# Patient Record
Sex: Female | Born: 1995
Health system: Southern US, Community
[De-identification: ages and names within clinical notes are randomized; demographics above are authoritative.]

## PROBLEM LIST (undated history)

## (undated) DIAGNOSIS — K8020 Calculus of gallbladder without cholecystitis without obstruction: Secondary | ICD-10-CM

## (undated) DIAGNOSIS — O139 Gestational [pregnancy-induced] hypertension without significant proteinuria, unspecified trimester: Secondary | ICD-10-CM

## (undated) DIAGNOSIS — Z789 Other specified health status: Secondary | ICD-10-CM

## (undated) HISTORY — DX: Other specified health status: Z78.9

## (undated) HISTORY — PX: NO PAST SURGERIES: SHX2092

## (undated) HISTORY — DX: Gestational (pregnancy-induced) hypertension without significant proteinuria, unspecified trimester: O13.9

---
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
## 2016-10-05 DIAGNOSIS — H5213 Myopia, bilateral: Secondary | ICD-10-CM | POA: Diagnosis not present

## 2017-08-02 MED FILL — PRENATAL 27-1 MG TAB: 27-1 | 30 days supply | Qty: 30 | Fill #0

## 2017-09-21 DIAGNOSIS — Z23 Encounter for immunization: Secondary | ICD-10-CM | POA: Diagnosis not present

## 2017-10-23 ENCOUNTER — Ambulatory Visit (INDEPENDENT_AMBULATORY_CARE_PROVIDER_SITE_OTHER): Payer: Medicaid Other | Admitting: Advanced Practice Midwife

## 2017-10-23 ENCOUNTER — Encounter: Payer: Self-pay | Admitting: Advanced Practice Midwife

## 2017-10-23 ENCOUNTER — Other Ambulatory Visit (HOSPITAL_COMMUNITY)
Admission: RE | Admit: 2017-10-23 | Discharge: 2017-10-23 | Disposition: A | Discharge status: Home / Self Care | Payer: Medicaid Other | Source: Ambulatory Visit | Attending: Advanced Practice Midwife | Admitting: Advanced Practice Midwife

## 2017-10-23 VITALS — BP 127/76 | HR 124 | Ht 65.0 in | Wt 169.0 lb

## 2017-10-23 DIAGNOSIS — Z3490 Encounter for supervision of normal pregnancy, unspecified, unspecified trimester: Secondary | ICD-10-CM

## 2017-10-23 DIAGNOSIS — O0932 Supervision of pregnancy with insufficient antenatal care, second trimester: Secondary | ICD-10-CM | POA: Diagnosis not present

## 2017-10-23 DIAGNOSIS — Z3A00 Weeks of gestation of pregnancy not specified: Secondary | ICD-10-CM | POA: Insufficient documentation

## 2017-10-23 DIAGNOSIS — Z3402 Encounter for supervision of normal first pregnancy, second trimester: Secondary | ICD-10-CM | POA: Insufficient documentation

## 2017-10-23 DIAGNOSIS — Z8659 Personal history of other mental and behavioral disorders: Secondary | ICD-10-CM | POA: Insufficient documentation

## 2017-10-23 NOTE — Progress Notes (Signed)
  Subjective:    Kristina Brown is a G1P0 10144w1d being seen today for her first obstetrical visit.  Her obstetrical history is significant for Late prenatal care, History of depression. Patient does intend to breast feed. Pregnancy history fully reviewed.  Patient reports no complaints.  Vitals:   10/23/17 0905 10/23/17 0907  BP: 127/76   Pulse: (!) 124   Weight: 169 lb (76.7 kg)   Height:  5\' 5"  (1.651 m)    HISTORY: OB History  Gravida Para Term Preterm AB Living  1            SAB TAB Ectopic Multiple Live Births               # Outcome Date GA Lbr Len/2nd Weight Sex Delivery Anes PTL Lv  1 Current              Past Medical History:  Diagnosis Date  . Medical history non-contributory    History reviewed. No pertinent surgical history. Family History  Problem Relation Age of Onset  . Diabetes Mother   . Stroke Maternal Grandmother   . Cancer Paternal Grandmother        breast  . Birth defects Neg Hx   . Hypertension Neg Hx      Exam    Uterus:     Pelvic Exam:    Perineum: Normal Perineum   Vulva: Bartholin's, Urethra, Skene's normal   Vagina:  normal discharge   pH:    Cervix: nulliparous appearance   Adnexa: no mass, fullness, tenderness   Bony Pelvis: gynecoid  System: Breast:  normal appearance, no masses or tenderness   Skin: normal coloration and turgor, no rashes    Neurologic: oriented, grossly non-focal   Extremities: normal strength, tone, and muscle mass   HEENT neck supple with midline trachea   Mouth/Teeth mucous membranes moist, pharynx normal without lesions   Neck supple   Cardiovascular: regular rate and rhythm   Respiratory:  appears well, vitals normal, no respiratory distress, acyanotic, normal RR, ear and throat exam is normal, neck free of mass or lymphadenopathy, chest clear, no wheezing, crepitations, rhonchi, normal symmetric air entry   Abdomen: soft, non-tender; bowel sounds normal; no masses,  no organomegaly   Urinary:  urethral meatus normal      Assessment:    Pregnancy: G1P0 Patient Active Problem List   Diagnosis Date Noted  . History of depression 10/23/2017  . Encounter for supervision of normal first pregnancy in second trimester 10/23/2017  . Late prenatal care affecting pregnancy in second trimester 10/23/2017        Plan:     Initial labs drawn. Prenatal vitamins. Problem list reviewed and updated. Genetic Screening discussed Quad Screen: requested.  Ultrasound discussed; fetal survey: ordered.  Follow up in 4 weeks. 50% of 30 min visit spent on counseling and coordination of care.   Routines reviewed Welcomed to practice Discussed nature of practice, providers and students. Pt is "totally fine" with students    Kristina Brown 10/23/2017

## 2017-10-23 NOTE — Patient Instructions (Signed)
Second Trimester of Pregnancy The second trimester is from week 13 through week 28, month 4 through 6. This is often the time in pregnancy that you feel your best. Often times, morning sickness has lessened or quit. You may have more energy, and you may get hungry more often. Your unborn baby (fetus) is growing rapidly. At the end of the sixth month, he or she is about 9 inches long and weighs about 1 pounds. You will likely feel the baby move (quickening) between 18 and 20 weeks of pregnancy. Follow these instructions at home:  Avoid all smoking, herbs, and alcohol. Avoid drugs not approved by your doctor.  Do not use any tobacco products, including cigarettes, chewing tobacco, and electronic cigarettes. If you need help quitting, ask your doctor. You may get counseling or other support to help you quit.  Only take medicine as told by your doctor. Some medicines are safe and some are not during pregnancy.  Exercise only as told by your doctor. Stop exercising if you start having cramps.  Eat regular, healthy meals.  Wear a good support bra if your breasts are tender.  Do not use hot tubs, steam rooms, or saunas.  Wear your seat belt when driving.  Avoid raw meat, uncooked cheese, and liter boxes and soil used by cats.  Take your prenatal vitamins.  Take 1500-2000 milligrams of calcium daily starting at the 20th week of pregnancy until you deliver your baby.  Try taking medicine that helps you poop (stool softener) as needed, and if your doctor approves. Eat more fiber by eating fresh fruit, vegetables, and whole grains. Drink enough fluids to keep your pee (urine) clear or pale yellow.  Take warm water baths (sitz baths) to soothe pain or discomfort caused by hemorrhoids. Use hemorrhoid cream if your doctor approves.  If you have puffy, bulging veins (varicose veins), wear support hose. Raise (elevate) your feet for 15 minutes, 3-4 times a day. Limit salt in your diet.  Avoid heavy  lifting, wear low heals, and sit up straight.  Rest with your legs raised if you have leg cramps or low back pain.  Visit your dentist if you have not gone during your pregnancy. Use a soft toothbrush to brush your teeth. Be gentle when you floss.  You can have sex (intercourse) unless your doctor tells you not to.  Go to your doctor visits. Get help if:  You feel dizzy.  You have mild cramps or pressure in your lower belly (abdomen).  You have a nagging pain in your belly area.  You continue to feel sick to your stomach (nauseous), throw up (vomit), or have watery poop (diarrhea).  You have bad smelling fluid coming from your vagina.  You have pain with peeing (urination). Get help right away if:  You have a fever.  You are leaking fluid from your vagina.  You have spotting or bleeding from your vagina.  You have severe belly cramping or pain.  You lose or gain weight rapidly.  You have trouble catching your breath and have chest pain.  You notice sudden or extreme puffiness (swelling) of your face, hands, ankles, feet, or legs.  You have not felt the baby move in over an hour.  You have severe headaches that do not go away with medicine.  You have vision changes. This information is not intended to replace advice given to you by your health care provider. Make sure you discuss any questions you have with your health care   provider. Document Released: 02/14/2010 Document Revised: 04/27/2016 Document Reviewed: 01/21/2013 Elsevier Interactive Patient Education  2017 [employer] Inc[employer]  

## 2017-10-24 LAB — OBSTETRIC PANEL, INCLUDING HIV
Antibody Screen: NEGATIVE
BASOS ABS: 0 10*3/uL (ref 0.0–0.2)
Basos: 0 %
EOS (ABSOLUTE): 0 10*3/uL (ref 0.0–0.4)
EOS: 0 %
HEMOGLOBIN: 11.5 g/dL (ref 11.1–15.9)
HEP B S AG: NEGATIVE
HIV Screen 4th Generation wRfx: NONREACTIVE
Hematocrit: 33.3 % — ABNORMAL LOW (ref 34.0–46.6)
IMMATURE GRANS (ABS): 0 10*3/uL (ref 0.0–0.1)
IMMATURE GRANULOCYTES: 0 %
LYMPHS: 13 %
Lymphocytes Absolute: 1.5 10*3/uL (ref 0.7–3.1)
MCH: 29.9 pg (ref 26.6–33.0)
MCHC: 34.5 g/dL (ref 31.5–35.7)
MCV: 87 fL (ref 79–97)
MONOCYTES: 5 %
Monocytes Absolute: 0.5 10*3/uL (ref 0.1–0.9)
NEUTROS PCT: 82 %
Neutrophils Absolute: 9.4 10*3/uL — ABNORMAL HIGH (ref 1.4–7.0)
PLATELETS: 293 10*3/uL (ref 150–379)
RBC: 3.85 x10E6/uL (ref 3.77–5.28)
RDW: 14.5 % (ref 12.3–15.4)
RH TYPE: POSITIVE
RPR: NONREACTIVE
RUBELLA: 2.15 {index} (ref >0.99)
WBC: 11.4 10*3/uL — AB (ref 3.4–10.8)

## 2017-10-25 LAB — URINE CULTURE, OB REFLEX

## 2017-10-25 LAB — CULTURE, OB URINE

## 2017-10-26 LAB — GC/CHLAMYDIA PROBE AMP (~~LOC~~) NOT AT ARMC
Chlamydia: NEGATIVE
NEISSERIA GONORRHEA: NEGATIVE

## 2017-11-01 ENCOUNTER — Encounter (HOSPITAL_COMMUNITY): Payer: Self-pay | Admitting: Advanced Practice Midwife

## 2017-11-08 ENCOUNTER — Ambulatory Visit (HOSPITAL_COMMUNITY)
Admission: RE | Admit: 2017-11-08 | Discharge: 2017-11-08 | Disposition: A | Discharge status: Home / Self Care | Payer: Medicaid Other | Source: Ambulatory Visit | Attending: Advanced Practice Midwife | Admitting: Advanced Practice Midwife

## 2017-11-08 ENCOUNTER — Other Ambulatory Visit: Payer: Self-pay | Admitting: Advanced Practice Midwife

## 2017-11-08 DIAGNOSIS — Z3A23 23 weeks gestation of pregnancy: Secondary | ICD-10-CM | POA: Insufficient documentation

## 2017-11-08 DIAGNOSIS — Z3689 Encounter for other specified antenatal screening: Secondary | ICD-10-CM | POA: Diagnosis not present

## 2017-11-08 DIAGNOSIS — Z3490 Encounter for supervision of normal pregnancy, unspecified, unspecified trimester: Secondary | ICD-10-CM

## 2017-11-08 DIAGNOSIS — O99212 Obesity complicating pregnancy, second trimester: Secondary | ICD-10-CM

## 2017-11-20 ENCOUNTER — Ambulatory Visit (INDEPENDENT_AMBULATORY_CARE_PROVIDER_SITE_OTHER): Payer: Medicaid Other | Admitting: Advanced Practice Midwife

## 2017-11-20 ENCOUNTER — Encounter: Payer: Self-pay | Admitting: Advanced Practice Midwife

## 2017-11-20 VITALS — BP 125/71 | HR 118 | Wt 174.0 lb

## 2017-11-20 DIAGNOSIS — Z3402 Encounter for supervision of normal first pregnancy, second trimester: Secondary | ICD-10-CM

## 2017-11-20 DIAGNOSIS — Z3490 Encounter for supervision of normal pregnancy, unspecified, unspecified trimester: Secondary | ICD-10-CM

## 2017-11-20 NOTE — Progress Notes (Signed)
   PRENATAL VISIT NOTE  Subjective:  Kristina Brown is a 21 y.o. G1P0 at 8730w1d being seen today for ongoing prenatal care.  She is currently monitored for the following issues for this low-risk pregnancy and has History of depression; Encounter for supervision of normal first pregnancy in second trimester; and Late prenatal care affecting pregnancy in second trimester on their problem list.  Patient reports backache and occasional sharp pain at sciatic joint after sitting.  Contractions: Not present. Vag. Bleeding: None.  Movement: Present. Denies leaking of fluid.   The following portions of the patient's history were reviewed and updated as appropriate: allergies, current medications, past family history, past medical history, past social history, past surgical history and problem list. Problem list updated.  Objective:   Vitals:   11/20/17 0933  BP: 125/71  Pulse: (!) 118  Weight: 174 lb (78.9 kg)    Fetal Status: Fetal Heart Rate (bpm): 154   Movement: Present     General:  Alert, oriented and cooperative. Patient is in no acute distress.  Skin: Skin is warm and dry. No rash noted.   Cardiovascular: Normal heart rate noted  Respiratory: Normal respiratory effort, no problems with respiration noted  Abdomen: Soft, gravid, appropriate for gestational age.  Pain/Pressure: Absent     Pelvic: Cervical exam deferred        Extremities: Normal range of motion.  Edema: None  Mental Status:  Normal mood and affect. Normal behavior. Normal judgment and thought content.   Assessment and Plan:  Pregnancy: G1P0 at 2230w1d                       Intermittent sharp sciatic pain due to softening of cartilage  Discussed relaxin hormone, joint softening.  Discussed management.    Reviewed signs to report or come to [facility] for.    Will schedule Glucose challenge test  Preterm labor symptoms and general obstetric precautions including but not limited to vaginal bleeding, contractions, leaking of  fluid and fetal movement were reviewed in detail with the patient. Please refer to After Visit Summary for other counseling recommendations.  Return in about 4 weeks (around 12/18/2017) for OB and GTT, [facility].   Kristina Brown, CNM

## 2017-11-20 NOTE — Patient Instructions (Signed)

## 2017-12-03 ENCOUNTER — Ambulatory Visit (INDEPENDENT_AMBULATORY_CARE_PROVIDER_SITE_OTHER): Payer: Medicaid Other | Admitting: Family Medicine

## 2017-12-03 DIAGNOSIS — Z3402 Encounter for supervision of normal first pregnancy, second trimester: Secondary | ICD-10-CM

## 2017-12-03 NOTE — Progress Notes (Signed)
   PRENATAL VISIT NOTE  Subjective:  Kristina Brown is a 21 y.o. G1P0 at 6939w0d being seen today for ongoing prenatal care.  She is currently monitored for the following issues for this low-risk pregnancy and has History of depression; Encounter for supervision of normal first pregnancy in second trimester; and Late prenatal care affecting pregnancy in second trimester on their problem list.  Patient reports no complaints.  Contractions: Not present. Vag. Bleeding: None.  Movement: Present. Denies leaking of fluid.   The following portions of the patient's history were reviewed and updated as appropriate: allergies, current medications, past family history, past medical history, past social history, past surgical history and problem list. Problem list updated.  Objective:   Vitals:   12/03/17 0817  BP: 118/75  Pulse: (!) 116  Weight: 178 lb (80.7 kg)    Fetal Status: Fetal Heart Rate (bpm): 150   Movement: Present     General:  Alert, oriented and cooperative. Patient is in no acute distress.  Skin: Skin is warm and dry. No rash noted.   Cardiovascular: Normal heart rate noted  Respiratory: Normal respiratory effort, no problems with respiration noted  Abdomen: Soft, gravid, appropriate for gestational age.  Pain/Pressure: Absent     Pelvic: Cervical exam deferred        Extremities: Normal range of motion.     Mental Status:  Normal mood and affect. Normal behavior. Normal judgment and thought content.   Assessment and Plan:  Pregnancy: G1P0 at 6939w0d  1. Encounter for supervision of normal first pregnancy in second trimester FHT and FH normal.  - Glucose Tolerance, 2 Hours w/1 Hour - CBC - HIV antibody - RPR - US MFM OB FOLLOW UP; Future  Preterm labor symptoms and general obstetric precautions including but not limited to vaginal bleeding, contractions, leaking of fluid and fetal movement were reviewed in detail with the patient. Please refer to After Visit Summary for  other counseling recommendations.  Return in about 4 weeks (around 12/31/2017).   Levie HeritageJacob J Kholton Coate, DO

## 2017-12-04 ENCOUNTER — Encounter: Payer: Self-pay | Admitting: Family Medicine

## 2017-12-04 LAB — CBC
HEMATOCRIT: 32.7 % — AB (ref 34.0–46.6)
HEMOGLOBIN: 11.7 g/dL (ref 11.1–15.9)
MCH: 30.9 pg (ref 26.6–33.0)
MCHC: 35.8 g/dL — AB (ref 31.5–35.7)
MCV: 86 fL (ref 79–97)
Platelets: 319 10*3/uL (ref 150–379)
RBC: 3.79 x10E6/uL (ref 3.77–5.28)
RDW: 13.5 % (ref 12.3–15.4)
WBC: 11.9 10*3/uL — ABNORMAL HIGH (ref 3.4–10.8)

## 2017-12-04 LAB — GLUCOSE TOLERANCE, 2 HOURS W/ 1HR
GLUCOSE, 2 HOUR: 91 mg/dL (ref 65–152)
Glucose, 1 hour: 100 mg/dL (ref 65–179)
Glucose, Fasting: 74 mg/dL (ref 65–91)

## 2017-12-04 LAB — RPR: RPR: NONREACTIVE

## 2017-12-04 LAB — HIV ANTIBODY (ROUTINE TESTING W REFLEX): HIV Screen 4th Generation wRfx: NONREACTIVE

## 2017-12-04 NOTE — L&D Delivery Note (Signed)
VAGINAL DELIVERY NOTE  22 y.o. G1P0 at 2269w2d delivered a viable female infant at 0405 in cephalic, OA position. No nuchal cord. Left anterior shoulder delivered with ease followed by 60 sec delayed cord clamping. Cord clamped x2 and cut. Placenta delivered spontaneously intact, with 3VC. Fundus firm on exam with massage and pitocin.  Mother: Anesthesia: epidural Laceration: 1st degree perineal x1 and right sulcal x1 Suture repair: 3.0 monocryl EBL: 350 mL  Baby: Apgars: 9, 9 Weight: pending Cord pH: not sent  Good hemostasis noted.  Mom to postpartum.  Baby to couplet-care/skin-to-skin.  Durward Parcelavid Nijah Tejera, DO, PGY-2 03/06/2018, 5:49 AM

## 2017-12-12 ENCOUNTER — Other Ambulatory Visit: Payer: Self-pay | Admitting: Family Medicine

## 2017-12-12 ENCOUNTER — Ambulatory Visit (HOSPITAL_COMMUNITY)
Admission: RE | Admit: 2017-12-12 | Discharge: 2017-12-12 | Disposition: A | Discharge status: Home / Self Care | Payer: Medicaid Other | Source: Ambulatory Visit | Attending: Family Medicine | Admitting: Family Medicine

## 2017-12-12 DIAGNOSIS — Z3689 Encounter for other specified antenatal screening: Secondary | ICD-10-CM | POA: Insufficient documentation

## 2017-12-12 DIAGNOSIS — Z3A28 28 weeks gestation of pregnancy: Secondary | ICD-10-CM | POA: Insufficient documentation

## 2017-12-12 DIAGNOSIS — IMO0002 Reserved for concepts with insufficient information to code with codable children: Secondary | ICD-10-CM

## 2017-12-12 DIAGNOSIS — Z3402 Encounter for supervision of normal first pregnancy, second trimester: Secondary | ICD-10-CM

## 2017-12-12 DIAGNOSIS — O99213 Obesity complicating pregnancy, third trimester: Secondary | ICD-10-CM | POA: Diagnosis not present

## 2017-12-12 DIAGNOSIS — Z6836 Body mass index (BMI) 36.0-36.9, adult: Secondary | ICD-10-CM | POA: Diagnosis not present

## 2017-12-12 DIAGNOSIS — Z0489 Encounter for examination and observation for other specified reasons: Secondary | ICD-10-CM

## 2017-12-31 ENCOUNTER — Ambulatory Visit (INDEPENDENT_AMBULATORY_CARE_PROVIDER_SITE_OTHER): Payer: Medicaid Other | Admitting: Obstetrics & Gynecology

## 2017-12-31 VITALS — BP 111/83 | HR 120 | Wt 184.0 lb

## 2017-12-31 DIAGNOSIS — O0932 Supervision of pregnancy with insufficient antenatal care, second trimester: Secondary | ICD-10-CM

## 2017-12-31 DIAGNOSIS — Z8659 Personal history of other mental and behavioral disorders: Secondary | ICD-10-CM

## 2017-12-31 DIAGNOSIS — Z23 Encounter for immunization: Secondary | ICD-10-CM

## 2017-12-31 DIAGNOSIS — Z3402 Encounter for supervision of normal first pregnancy, second trimester: Secondary | ICD-10-CM

## 2017-12-31 NOTE — Progress Notes (Signed)
   PRENATAL VISIT NOTE  Subjective:  Kristina Brown is a 22 y.o. G1P0 at 6220w0d being seen today for ongoing prenatal care.  She is currently monitored for the following issues for this low-risk pregnancy and has History of depression; Encounter for supervision of normal first pregnancy in second trimester; and Late prenatal care affecting pregnancy in second trimester on their problem list.  Patient reports no complaints.  Contractions: Not present. Vag. Bleeding: None.  Movement: Present. Denies leaking of fluid.   The following portions of the patient's history were reviewed and updated as appropriate: allergies, current medications, past family history, past medical history, past social history, past surgical history and problem list. Problem list updated.  Objective:   Vitals:   12/31/17 0932  BP: 111/83  Pulse: (!) 120  Weight: 184 lb (83.5 kg)    Fetal Status: Fetal Heart Rate (bpm): 160   Movement: Present     General:  Alert, oriented and cooperative. Patient is in no acute distress.  Skin: Skin is warm and dry. No rash noted.   Cardiovascular: Normal heart rate noted  Respiratory: Normal respiratory effort, no problems with respiration noted  Abdomen: Soft, gravid, appropriate for gestational age.  Pain/Pressure: Absent     Pelvic: Cervical exam deferred        Extremities: Normal range of motion.     Mental Status:  Normal mood and affect. Normal behavior. Normal judgment and thought content.   Assessment and Plan:  Pregnancy: G1P0 at 2920w0d  1. Late prenatal care affecting pregnancy in second trimester Tdap today  2. History of depression Pt reports that this is stable at present  3. Encounter for supervision of normal first pregnancy in second trimester   Preterm labor symptoms and general obstetric precautions including but not limited to vaginal bleeding, contractions, leaking of fluid and fetal movement were reviewed in detail with the patient. Please refer  to After Visit Summary for other counseling recommendations.  Return in about 2 weeks (around 01/14/2018).   Willodean Rosenthalarolyn Harraway-Smith, MD

## 2018-01-14 ENCOUNTER — Ambulatory Visit (INDEPENDENT_AMBULATORY_CARE_PROVIDER_SITE_OTHER): Payer: Medicaid Other | Admitting: Family Medicine

## 2018-01-14 VITALS — BP 132/82 | HR 132 | Wt 185.0 lb

## 2018-01-14 DIAGNOSIS — Z8659 Personal history of other mental and behavioral disorders: Secondary | ICD-10-CM

## 2018-01-14 DIAGNOSIS — Z3402 Encounter for supervision of normal first pregnancy, second trimester: Secondary | ICD-10-CM

## 2018-01-14 DIAGNOSIS — O0932 Supervision of pregnancy with insufficient antenatal care, second trimester: Secondary | ICD-10-CM

## 2018-01-14 NOTE — Patient Instructions (Signed)

## 2018-01-14 NOTE — Progress Notes (Signed)
   PRENATAL VISIT NOTE  Subjective:  Kristina Brown is a 22 y.o. G1P0 at 7954w0d being seen today for ongoing prenatal care.  She is currently monitored for the following issues for this low-risk pregnancy and has History of depression; Encounter for supervision of normal first pregnancy in second trimester; and Late prenatal care affecting pregnancy in second trimester on their problem list.  Patient reports no complaints.  Contractions: Not present. Vag. Bleeding: None.  Movement: Present. Denies leaking of fluid.   The following portions of the patient's history were reviewed and updated as appropriate: allergies, current medications, past family history, past medical history, past social history, past surgical history and problem list. Problem list updated.  Objective:   Vitals:   01/14/18 0925  BP: 132/82  Pulse: (!) 132  Weight: 185 lb (83.9 kg)    Fetal Status: Fetal Heart Rate (bpm): 158   Movement: Present     General:  Alert, oriented and cooperative. Patient is in no acute distress.  Skin: Skin is warm and dry. No rash noted.   Cardiovascular: Normal heart rate noted  Respiratory: Normal respiratory effort, no problems with respiration noted  Abdomen: Soft, gravid, appropriate for gestational age.  Pain/Pressure: Absent     Pelvic: Cervical exam deferred        Extremities: Normal range of motion.  Edema: None  Mental Status:  Normal mood and affect. Normal behavior. Normal judgment and thought content.   Assessment and Plan:  Pregnancy: G1P0 at 2954w0d  1. Encounter for supervision of normal first pregnancy in second trimester FHT and FH normal  2. Late prenatal care affecting pregnancy in second trimester  3. History of depression stable  Preterm labor symptoms and general obstetric precautions including but not limited to vaginal bleeding, contractions, leaking of fluid and fetal movement were reviewed in detail with the patient. Please refer to After Visit  Summary for other counseling recommendations.  Return in about 2 weeks (around 01/28/2018) for OB f/u.   Levie HeritageJacob J Izabel Chim, DO

## 2018-01-14 NOTE — Progress Notes (Signed)
Patient states she has a cold. Patient has tried benadryl at night. Armandina StammerJennifer Howard RNBSN

## 2018-01-28 ENCOUNTER — Ambulatory Visit (INDEPENDENT_AMBULATORY_CARE_PROVIDER_SITE_OTHER): Payer: Medicaid Other | Admitting: Family Medicine

## 2018-01-28 VITALS — BP 127/88 | HR 138 | Wt 188.0 lb

## 2018-01-28 DIAGNOSIS — O0932 Supervision of pregnancy with insufficient antenatal care, second trimester: Secondary | ICD-10-CM

## 2018-01-28 DIAGNOSIS — Z3402 Encounter for supervision of normal first pregnancy, second trimester: Secondary | ICD-10-CM

## 2018-01-28 NOTE — Progress Notes (Signed)
   PRENATAL VISIT NOTE  Subjective:  Solon AugustaLauren R Levandowski is a 22 y.o. G1P0 at 3668w0d being seen today for ongoing prenatal care.  She is currently monitored for the following issues for this low-risk pregnancy and has History of depression; Encounter for supervision of normal first pregnancy in second trimester; and Late prenatal care affecting pregnancy in second trimester on their problem list.  Patient reports no complaints.  Contractions: Not present. Vag. Bleeding: None.  Movement: Present. Denies leaking of fluid.   The following portions of the patient's history were reviewed and updated as appropriate: allergies, current medications, past family history, past medical history, past social history, past surgical history and problem list. Problem list updated.  Objective:   Vitals:   01/28/18 0932  BP: 127/88  Pulse: (!) 138  Weight: 188 lb (85.3 kg)    Fetal Status: Fetal Heart Rate (bpm): 151 Fundal Height: 34 cm Movement: Present     General:  Alert, oriented and cooperative. Patient is in no acute distress.  Skin: Skin is warm and dry. No rash noted.   Cardiovascular: Normal heart rate noted  Respiratory: Normal respiratory effort, no problems with respiration noted  Abdomen: Soft, gravid, appropriate for gestational age.  Pain/Pressure: Absent     Pelvic: Cervical exam deferred        Extremities: Normal range of motion.  Edema: None  Mental Status:  Normal mood and affect. Normal behavior. Normal judgment and thought content.   Assessment and Plan:  Pregnancy: G1P0 at 2468w0d  1. Encounter for supervision of normal first pregnancy in second trimester FHT and FH normal  2. Late prenatal care affecting pregnancy in second trimester   Preterm labor symptoms and general obstetric precautions including but not limited to vaginal bleeding, contractions, leaking of fluid and fetal movement were reviewed in detail with the patient. Please refer to After Visit Summary for other  counseling recommendations.  Return in about 1 week (around 02/04/2018) for OB f/u.   Levie HeritageJacob J Marvin Maenza, DO

## 2018-02-05 ENCOUNTER — Ambulatory Visit (INDEPENDENT_AMBULATORY_CARE_PROVIDER_SITE_OTHER): Payer: Medicaid Other | Admitting: Advanced Practice Midwife

## 2018-02-05 ENCOUNTER — Encounter: Payer: Self-pay | Admitting: Advanced Practice Midwife

## 2018-02-05 ENCOUNTER — Other Ambulatory Visit (HOSPITAL_COMMUNITY)
Admission: RE | Admit: 2018-02-05 | Discharge: 2018-02-05 | Disposition: A | Discharge status: Home / Self Care | Payer: Medicaid Other | Source: Ambulatory Visit | Attending: Advanced Practice Midwife | Admitting: Advanced Practice Midwife

## 2018-02-05 VITALS — BP 132/80 | HR 128 | Wt 190.0 lb

## 2018-02-05 DIAGNOSIS — Z3490 Encounter for supervision of normal pregnancy, unspecified, unspecified trimester: Secondary | ICD-10-CM

## 2018-02-05 DIAGNOSIS — Z3493 Encounter for supervision of normal pregnancy, unspecified, third trimester: Secondary | ICD-10-CM | POA: Diagnosis not present

## 2018-02-05 DIAGNOSIS — Z3480 Encounter for supervision of other normal pregnancy, unspecified trimester: Secondary | ICD-10-CM

## 2018-02-05 LAB — OB RESULTS CONSOLE GBS: STREP GROUP B AG: NEGATIVE

## 2018-02-05 LAB — OB RESULTS CONSOLE GC/CHLAMYDIA: Gonorrhea: NEGATIVE

## 2018-02-05 NOTE — Patient Instructions (Signed)
Vaginal delivery means that you will give birth by pushing your baby out of your birth canal (vagina). A team of health care providers will help you before, during, and after vaginal delivery. Birth experiences are unique for every woman and every pregnancy, and birth experiences vary depending on where you choose to give birth. What should I do to prepare for my baby's birth? Before your baby is born, it is important to talk with your health care provider about:  Your labor and delivery preferences. These may include: ? Medicines that you may be given. ? How you will manage your pain. This might include non-medical pain relief techniques or injectable pain relief such as epidural analgesia. ? How you and your baby will be monitored during labor and delivery. ? Who may be in the labor and delivery room with you. ? Your feelings about surgical delivery of your baby (cesarean delivery, or C-section) if this becomes necessary. ? Your feelings about receiving donated blood through an IV tube (blood transfusion) if this becomes necessary.  Whether you are able: ? To take pictures or videos of the birth. ? To eat during labor and delivery. ? To move around, walk, or change positions during labor and delivery.  What to expect after your baby is born, such as: ? Whether delayed umbilical cord clamping and cutting is offered. ? Who will care for your baby right after birth. ? Medicines or tests that may be recommended for your baby. ? Whether breastfeeding is supported in your hospital or birth center. ? How long you will be in the hospital or birth center.  How any medical conditions you have may affect your baby or your labor and delivery experience.  To prepare for your baby's birth, you should also:  Attend all of your health care visits before delivery (prenatal visits) as recommended by your health care provider. This is important.  Prepare your home for your baby's arrival. Make sure  that you have: ? Diapers. ? Baby clothing. ? Feeding equipment. ? Safe sleeping arrangements for you and your baby.  Install a car seat in your vehicle. Have your car seat checked by a certified car seat installer to make sure that it is installed safely.  Think about who will help you with your new baby at home for at least the first several weeks after delivery.  What can I expect when I arrive at the birth center or hospital? Once you are in labor and have been admitted into the hospital or birth center, your health care provider may:  Review your pregnancy history and any concerns you have.  Insert an IV tube into one of your veins. This is used to give you fluids and medicines.  Check your blood pressure, pulse, temperature, and heart rate (vital signs).  Check whether your bag of water (amniotic sac) has broken (ruptured).  Talk with you about your birth plan and discuss pain control options.  Monitoring Your health care provider may monitor your contractions (uterine monitoring) and your baby's heart rate (fetal monitoring). You may need to be monitored:  Often, but not continuously (intermittently).  All the time or for long periods at a time (continuously). Continuous monitoring may be needed if: ? You are taking certain medicines, such as medicine to relieve pain or make your contractions stronger. ? You have pregnancy or labor complications.  Monitoring may be done by:  Placing a special stethoscope or a handheld monitoring device on your abdomen to check your   baby's heartbeat, and feeling your abdomen for contractions. This method of monitoring does not continuously record your baby's heartbeat or your contractions.  Placing monitors on your abdomen (external monitors) to record your baby's heartbeat and the frequency and length of contractions. You may not have to wear external monitors all the time.  Placing monitors inside of your uterus (internal monitors) to  record your baby's heartbeat and the frequency, length, and strength of your contractions. ? Your health care provider may use internal monitors if he or she needs more information about the strength of your contractions or your baby's heart rate. ? Internal monitors are put in place by passing a thin, flexible wire through your vagina and into your uterus. Depending on the type of monitor, it may remain in your uterus or on your baby's head until birth. ? Your health care provider will discuss the benefits and risks of internal monitoring with you and will ask for your permission before inserting the monitors.  Telemetry. This is a type of continuous monitoring that can be done with external or internal monitors. Instead of having to stay in bed, you are able to move around during telemetry. Ask your health care provider if telemetry is an option for you.  Physical exam Your health care provider may perform a physical exam. This may include:  Checking whether your baby is positioned: ? With the head toward your vagina (head-down). This is most common. ? With the head toward the top of your uterus (head-up or breech). If your baby is in a breech position, your health care provider may try to turn your baby to a head-down position so you can deliver vaginally. If it does not seem that your baby can be born vaginally, your provider may recommend surgery to deliver your baby. In rare cases, you may be able to deliver vaginally if your baby is head-up (breech delivery). ? Lying sideways (transverse). Babies that are lying sideways cannot be delivered vaginally.  Checking your cervix to determine: ? Whether it is thinning out (effacing). ? Whether it is opening up (dilating). ? How low your baby has moved into your birth canal.  What are the three stages of labor and delivery?  Normal labor and delivery is divided into the following three stages: Stage 1  Stage 1 is the longest stage of labor,  and it can last for hours or days. Stage 1 includes: ? Early labor. This is when contractions may be irregular, or regular and mild. Generally, early labor contractions are more than 10 minutes apart. ? Active labor. This is when contractions get longer, more regular, more frequent, and more intense. ? The transition phase. This is when contractions happen very close together, are very intense, and may last longer than during any other part of labor.  Contractions generally feel mild, infrequent, and irregular at first. They get stronger, more frequent (about every 2-3 minutes), and more regular as you progress from early labor through active labor and transition.  Many women progress through stage 1 naturally, but you may need help to continue making progress. If this happens, your health care provider may talk with you about: ? Rupturing your amniotic sac if it has not ruptured yet. ? Giving you medicine to help make your contractions stronger and more frequent.  Stage 1 ends when your cervix is completely dilated to 4 inches (10 cm) and completely effaced. This happens at the end of the transition phase. Stage 2  Once your cervix   is completely effaced and dilated to 4 inches (10 cm), you may start to feel an urge to push. It is common for the body to naturally take a rest before feeling the urge to push, especially if you received an epidural or certain other pain medicines. This rest period may last for up to 1-2 hours, depending on your unique labor experience.  During stage 2, contractions are generally less painful, because pushing helps relieve contraction pain. Instead of contraction pain, you may feel stretching and burning pain, especially when the widest part of your baby's head passes through the vaginal opening (crowning).  Your health care provider will closely monitor your pushing progress and your baby's progress through the vagina during stage 2.  Your health care provider may  massage the area of skin between your vaginal opening and anus (perineum) or apply warm compresses to your perineum. This helps it stretch as the baby's head starts to crown, which can help prevent perineal tearing. ? In some cases, an incision may be made in your perineum (episiotomy) to allow the baby to pass through the vaginal opening. An episiotomy helps to make the opening of the vagina larger to allow more room for the baby to fit through.  It is very important to breathe and focus so your health care provider can control the delivery of your baby's head. Your health care provider may have you decrease the intensity of your pushing, to help prevent perineal tearing.  After delivery of your baby's head, the shoulders and the rest of the body generally deliver very quickly and without difficulty.  Once your baby is delivered, the umbilical cord may be cut right away, or this may be delayed for 1-2 minutes, depending on your baby's health. This may vary among health care providers, hospitals, and birth centers.  If you and your baby are healthy enough, your baby may be placed on your chest or abdomen to help maintain the baby's temperature and to help you bond with each other. Some mothers and babies start breastfeeding at this time. Your health care team will dry your baby and help keep your baby warm during this time.  Your baby may need immediate care if he or she: ? Showed signs of distress during labor. ? Has a medical condition. ? Was born too early (prematurely). ? Had a bowel movement before birth (meconium). ? Shows signs of difficulty transitioning from being inside the uterus to being outside of the uterus. If you are planning to breastfeed, your health care team will help you begin a feeding. Stage 3  The third stage of labor starts immediately after the birth of your baby and ends after you deliver the placenta. The placenta is an organ that develops during pregnancy to provide  oxygen and nutrients to your baby in the womb.  Delivering the placenta may require some pushing, and you may have mild contractions. Breastfeeding can stimulate contractions to help you deliver the placenta.  After the placenta is delivered, your uterus should tighten (contract) and become firm. This helps to stop bleeding in your uterus. To help your uterus contract and to control bleeding, your health care provider may: ? Give you medicine by injection, through an IV tube, by mouth, or through your rectum (rectally). ? Massage your abdomen or perform a vaginal exam to remove any blood clots that are left in your uterus. ? Empty your bladder by placing a thin, flexible tube (catheter) into your bladder. ? Encourage you to   breastfeed your baby. After labor is over, you and your baby will be monitored closely to ensure that you are both healthy until you are ready to go home. Your health care team will teach you how to care for yourself and your baby. This information is not intended to replace advice given to you by your health care provider. Make sure you discuss any questions you have with your health care provider. Document Released: 08/29/2008 Document Revised: 06/09/2016 Document Reviewed: 12/05/2015 Elsevier Interactive Patient Education  2018 [employer]  

## 2018-02-05 NOTE — Progress Notes (Signed)
   PRENATAL VISIT NOTE  Subjective:  Kristina Brown is a 22 y.o. G1P0 at 9754w1d being seen today for ongoing prenatal care.  She is currently monitored for the following issues for this low-risk pregnancy and has History of depression; Encounter for supervision of normal first pregnancy in second trimester; and Late prenatal care affecting pregnancy in second trimester on their problem list.  Patient reports no complaints.  Contractions: Not present. Vag. Bleeding: None.  Movement: Present. Denies leaking of fluid.   The following portions of the patient's history were reviewed and updated as appropriate: allergies, current medications, past family history, past medical history, past social history, past surgical history and problem list. Problem list updated.  Objective:   Vitals:   02/05/18 0942  BP: 132/80  Pulse: (!) 128  Weight: 190 lb (86.2 kg)    Fetal Status: Fetal Heart Rate (bpm): 160 Fundal Height: 35 cm Movement: Present  Presentation: Vertex  General:  Alert, oriented and cooperative. Patient is in no acute distress.  Skin: Skin is warm and dry. No rash noted.   Cardiovascular: Normal heart rate noted  Respiratory: Normal respiratory effort, no problems with respiration noted  Abdomen: Soft, gravid, appropriate for gestational age.  Pain/Pressure: Absent     Pelvic: Cervical exam performed Dilation: 1 Effacement (%): 60 Station: -2  Extremities: Normal range of motion.  Edema: None  Mental Status:  Normal mood and affect. Normal behavior. Normal judgment and thought content.   Assessment and Plan:  Pregnancy: G1P0 at 5654w1d  1. Prenatal care, antepartum - Culture, beta strep (group b only) - GC[facility]Chlamydia probe amp [facility]  2. Supervision of other normal pregnancy, antepartum  Term labor symptoms and general obstetric precautions including but not limited to vaginal bleeding, contractions, leaking of fluid and fetal movement were reviewed in detail  with the patient. Please refer to After Visit Summary for other counseling recommendations.  Return in about 1 week (around 02/12/2018).   Thressa ShellerHeather Kit Mollett, CNM

## 2018-02-06 LAB — GC/CHLAMYDIA PROBE AMP (~~LOC~~) NOT AT ARMC
Chlamydia: NEGATIVE
NEISSERIA GONORRHEA: NEGATIVE

## 2018-02-09 LAB — CULTURE, BETA STREP (GROUP B ONLY): Strep Gp B Culture: NEGATIVE

## 2018-02-11 ENCOUNTER — Encounter: Payer: Self-pay | Admitting: Obstetrics & Gynecology

## 2018-02-11 ENCOUNTER — Ambulatory Visit (INDEPENDENT_AMBULATORY_CARE_PROVIDER_SITE_OTHER): Payer: Medicaid Other | Admitting: Obstetrics & Gynecology

## 2018-02-11 VITALS — BP 130/80 | HR 109 | Wt 192.0 lb

## 2018-02-11 DIAGNOSIS — O0932 Supervision of pregnancy with insufficient antenatal care, second trimester: Secondary | ICD-10-CM

## 2018-02-11 DIAGNOSIS — Z8659 Personal history of other mental and behavioral disorders: Secondary | ICD-10-CM

## 2018-02-11 DIAGNOSIS — Z3402 Encounter for supervision of normal first pregnancy, second trimester: Secondary | ICD-10-CM

## 2018-02-11 NOTE — Patient Instructions (Signed)
Natural Childbirth Natural childbirth is going through labor and delivery without any drugs to relieve pain. Additionally, fetal monitors are not used, a delivery is not done, and a surgical cut to enlarge the vaginal opening (episiotomy) is not made. With the help of a birthing professional (midwife or health care provider), you direct your own labor and delivery. Many women choose natural childbirth because it makes them feel more in control and in touch with their labor and delivery. Some woman also choose natural childbirth because they are concerned about medicines affecting them and their babies. Pregnant women with a high-risk pregnancy should not attempt natural childbirth. It is better to deliver the infant in a hospital if an emergency situation arises. Sometimes, a health care provider has to get involved for the health and safety of the mother and infant. Techniques for natural childbirth  The Lamaze method-This method teaches parents that having a baby is normal, healthy, and natural. It also teaches the mother to take a neutral position regarding pain medicine and numbing medicines and to make an informed decision about using these medicines when the time comes.  The Bradley method (also called husband-coached birth)-This method teaches the father or partner to be the birth coach. It encourages the mother to exercise and eat a balanced, nutritious diet. It also involves relaxation and deep breathing exercises and preparing the parents for emergency situations. Methods of dealing with labor pain and delivery  Meditation.  Yoga.  Hypnosis.  Acupuncture.  Massage.  Changing positions (walking, rocking, showering, leaning on birth balls).  Lying in warm water or a whirlpool bath.  Finding an activity that keeps your mind off of the labor pain.  Listening to soft music.  Focusing on a particular object (visual imagery). Before going into labor  Be sure you and your spouse or  partner are in agreement about having a natural childbirth.  Decide if your health care provider or a midwife will deliver your baby.  Decide if you will have your baby in the hospital, at a birthing center, or at home.  If you have children, make plans to have someone take care of them when you go to the hospital or birthing center.  Know the distance and the time it takes to go to the hospital or birthing center. Practice going there and time it to be sure.  Have a bag packed with a nightgown, bathrobe, and toiletries. Be ready to take it with you when you go into labor.  Keep phone numbers of your family and friends handy if you need to call someone when you go into labor.  Your spouse or partner should go to all the natural childbirth technique classes.  Talk with your health care provider about the possibility of a medical emergency and what will happen if that occurs. Advantages of natural childbirth  You are in control of your labor and delivery.  You will not take medicines that could affect you and the baby.  There are no invasive procedures, such as an episiotomy.  You and your spouse or partner will work together, which can increase your bond with each other.  In most delivery centers, your family and friends can be involved in the labor and delivery process. Disadvantages of natural childbirth  You will experience pain during your labor and delivery.  The methods of helping relieve your labor pains may not work for you.  You may feel disappointed if you decide to change your mind during labor and not   have a natural childbirth. After the delivery  You will be very tired.  You will be uncomfortable because of your uterus contracting. You will feel soreness around the vagina.  You may feel cold and shaky. This is a natural reaction. This information is not intended to replace advice given to you by your health care provider. Make sure you discuss any questions you  have with your health care provider. Document Released: 11/02/2008 Document Revised: 04/27/2016 Document Reviewed: 07/28/2013 Elsevier Interactive Patient Education  2017 [employer]vier Inc.  

## 2018-02-11 NOTE — Progress Notes (Signed)
   PRENATAL VISIT NOTE  Subjective:  Kristina Brown is a 22 y.o. G1P0 at 2783w0d being seen today for ongoing prenatal care.  She is currently monitored for the following issues for this low-risk pregnancy and has History of depression; Encounter for supervision of normal first pregnancy in second trimester; and Late prenatal care affecting pregnancy in second trimester on their problem list.  Patient reports no complaints.  Contractions: Not present. Vag. Bleeding: None.  Movement: Present. Denies leaking of fluid.   The following portions of the patient's history were reviewed and updated as appropriate: allergies, current medications, past family history, past medical history, past social history, past surgical history and problem list. Problem list updated.  Objective:   Vitals:   02/11/18 0925  BP: 130/80  Pulse: (!) 109  Weight: 192 lb (87.1 kg)    Fetal Status: Fetal Heart Rate (bpm): 145   Movement: Present     General:  Alert, oriented and cooperative. Patient is in no acute distress.  Skin: Skin is warm and dry. No rash noted.   Cardiovascular: Normal heart rate noted  Respiratory: Normal respiratory effort, no problems with respiration noted  Abdomen: Soft, gravid, appropriate for gestational age.  Pain/Pressure: Absent     Pelvic: Cervical exam deferred        Extremities: Normal range of motion.  Edema: None  Mental Status:  Normal mood and affect. Normal behavior. Normal judgment and thought content.   Assessment and Plan:  Pregnancy: G1P0 at 6283w0d  1. Encounter for supervision of normal first pregnancy in second trimester Watch BPs 2. History of depression  3. Late prenatal care affecting pregnancy in second trimester  Preterm labor symptoms and general obstetric precautions including but not limited to vaginal bleeding, contractions, leaking of fluid and fetal movement were reviewed in detail with the patient. Please refer to After Visit Summary for other  counseling recommendations.  Return in about 1 week (around 02/18/2018).   Willodean Rosenthalarolyn Harraway-Smith, MD

## 2018-02-18 ENCOUNTER — Ambulatory Visit (INDEPENDENT_AMBULATORY_CARE_PROVIDER_SITE_OTHER): Payer: Medicaid Other | Admitting: Family Medicine

## 2018-02-18 VITALS — BP 120/82 | HR 117 | Wt 191.0 lb

## 2018-02-18 DIAGNOSIS — R109 Unspecified abdominal pain: Secondary | ICD-10-CM

## 2018-02-18 DIAGNOSIS — O26893 Other specified pregnancy related conditions, third trimester: Secondary | ICD-10-CM

## 2018-02-18 DIAGNOSIS — O26899 Other specified pregnancy related conditions, unspecified trimester: Secondary | ICD-10-CM | POA: Diagnosis not present

## 2018-02-18 DIAGNOSIS — Z3402 Encounter for supervision of normal first pregnancy, second trimester: Secondary | ICD-10-CM

## 2018-02-18 DIAGNOSIS — Z3403 Encounter for supervision of normal first pregnancy, third trimester: Secondary | ICD-10-CM

## 2018-02-18 NOTE — Progress Notes (Signed)
   PRENATAL VISIT NOTE  Subjective:  Kristina Brown is a 22 y.o. G1P0 at [redacted]w[redacted]d being seen today for ongoing prenatal care.  She is currently monitored for the following issues for this low-risk pregnancy and has History of depression; Encounter for supervision of normal first pregnancy in second trimester; and Late prenatal care affecting pregnancy in second trimester on their problem list.  Patient reports severe, constant sharp epigastric abdominal pain around 10pm last night. Radiation into mid back. Worse when she laid down. + nausea. No consitpation, diarrhea. Didn't eat anything abnormal before hand. No one else sick. Finally went to sleep, now mild. Did take TUMS without improvement. Good fetal movement.  Contractions: Irregular. Vag. Bleeding: None.  Movement: Present. Denies leaking of fluid.   The following portions of the patient's history were reviewed and updated as appropriate: allergies, current medications, past family history, past medical history, past social history, past surgical history and problem list. Problem list updated.  Objective:   Vitals:   02/18/18 1015  BP: 120/82  Pulse: (!) 117  Weight: 191 lb (86.6 kg)    Fetal Status: Fetal Heart Rate (bpm): 145   Movement: Present     General:  Alert, oriented and cooperative. Patient is in no acute distress.  Skin: Skin is warm and dry. No rash noted.   Cardiovascular: Normal heart rate noted  Respiratory: Normal respiratory effort, no problems with respiration noted  Abdomen: Soft, gravid, appropriate for gestational age.  Pain/Pressure: Present     Pelvic: Cervical exam deferred        Extremities: Normal range of motion.     Mental Status:  Normal mood and affect. Normal behavior. Normal judgment and thought content.   Assessment and Plan:  Pregnancy: G1P0 at [redacted]w[redacted]d  1. Encounter for supervision of normal first pregnancy in second trimester FHT and FH normal  2. Abdominal pain affecting pregnancy Unlikely  gallstone, GERD, ulcer. ? Pancreatitis. Check labs: - Comp Met (CMET) - CBC - Lipase Discussed reassurance that this is significantly improved from last night. If returns go to [facility].  Preterm labor symptoms and general obstetric precautions including but not limited to vaginal bleeding, contractions, leaking of fluid and fetal movement were reviewed in detail with the patient. Please refer to After Visit Summary for other counseling recommendations.  No Follow-up on file.   Jacob J Stinson, DO  

## 2018-02-18 NOTE — Progress Notes (Signed)
Pt has been having severe upper abd pain since last night. Pt took Tums with no relief. Pain still present-dull pain, not as bad this morning.

## 2018-02-19 ENCOUNTER — Other Ambulatory Visit: Payer: Self-pay | Admitting: Family Medicine

## 2018-02-19 DIAGNOSIS — R7401 Elevation of levels of liver transaminase levels: Secondary | ICD-10-CM | POA: Insufficient documentation

## 2018-02-19 DIAGNOSIS — R740 Nonspecific elevation of levels of transaminase and lactic acid dehydrogenase [LDH]: Principal | ICD-10-CM

## 2018-02-19 LAB — COMPREHENSIVE METABOLIC PANEL
A/G RATIO: 1.1 — AB (ref 1.2–2.2)
ALBUMIN: 3.5 g/dL (ref 3.5–5.5)
ALK PHOS: 190 IU/L — AB (ref 39–117)
ALT: 53 IU/L — ABNORMAL HIGH (ref 0–32)
AST: 74 IU/L — ABNORMAL HIGH (ref 0–40)
BILIRUBIN TOTAL: 0.9 mg/dL (ref 0.0–1.2)
BUN / CREAT RATIO: 7 — AB (ref 9–23)
BUN: 4 mg/dL — ABNORMAL LOW (ref 6–20)
CO2: 18 mmol/L — AB (ref 20–29)
CREATININE: 0.54 mg/dL — AB (ref 0.57–1.00)
Calcium: 8.9 mg/dL (ref 8.7–10.2)
Chloride: 104 mmol/L (ref 96–106)
GFR calc Af Amer: 156 mL/min/{1.73_m2} (ref >59)
GFR calc non Af Amer: 135 mL/min/{1.73_m2} (ref >59)
GLOBULIN, TOTAL: 3.1 g/dL (ref 1.5–4.5)
Glucose: 74 mg/dL (ref 65–99)
POTASSIUM: 4.5 mmol/L (ref 3.5–5.2)
SODIUM: 138 mmol/L (ref 134–144)
Total Protein: 6.6 g/dL (ref 6.0–8.5)

## 2018-02-19 LAB — CBC
Hematocrit: 37.5 % (ref 34.0–46.6)
Hemoglobin: 12.4 g/dL (ref 11.1–15.9)
MCH: 29.2 pg (ref 26.6–33.0)
MCHC: 33.1 g/dL (ref 31.5–35.7)
MCV: 88 fL (ref 79–97)
PLATELETS: 303 10*3/uL (ref 150–379)
RBC: 4.24 x10E6/uL (ref 3.77–5.28)
RDW: 13.8 % (ref 12.3–15.4)
WBC: 12.3 10*3/uL — AB (ref 3.4–10.8)

## 2018-02-19 LAB — LIPASE: Lipase: 30 U/L (ref 14–72)

## 2018-02-21 ENCOUNTER — Ambulatory Visit (HOSPITAL_COMMUNITY)
Admission: RE | Admit: 2018-02-21 | Discharge: 2018-02-21 | Disposition: A | Discharge status: Home / Self Care | Payer: Medicaid Other | Source: Ambulatory Visit | Attending: Family Medicine | Admitting: Family Medicine

## 2018-02-21 DIAGNOSIS — O26613 Liver and biliary tract disorders in pregnancy, third trimester: Secondary | ICD-10-CM | POA: Diagnosis not present

## 2018-02-21 DIAGNOSIS — Z3A38 38 weeks gestation of pregnancy: Secondary | ICD-10-CM | POA: Insufficient documentation

## 2018-02-21 DIAGNOSIS — R7401 Elevation of levels of liver transaminase levels: Secondary | ICD-10-CM

## 2018-02-21 DIAGNOSIS — R740 Nonspecific elevation of levels of transaminase and lactic acid dehydrogenase [LDH]: Secondary | ICD-10-CM | POA: Insufficient documentation

## 2018-02-21 DIAGNOSIS — K8020 Calculus of gallbladder without cholecystitis without obstruction: Secondary | ICD-10-CM | POA: Diagnosis not present

## 2018-02-25 ENCOUNTER — Ambulatory Visit (INDEPENDENT_AMBULATORY_CARE_PROVIDER_SITE_OTHER): Payer: Medicaid Other | Admitting: Family Medicine

## 2018-02-25 ENCOUNTER — Telehealth (HOSPITAL_COMMUNITY): Payer: Self-pay | Admitting: *Deleted

## 2018-02-25 ENCOUNTER — Encounter (HOSPITAL_COMMUNITY): Payer: Self-pay | Admitting: *Deleted

## 2018-02-25 VITALS — BP 140/89 | HR 130 | Wt 195.0 lb

## 2018-02-25 DIAGNOSIS — R030 Elevated blood-pressure reading, without diagnosis of hypertension: Secondary | ICD-10-CM

## 2018-02-25 DIAGNOSIS — Z3402 Encounter for supervision of normal first pregnancy, second trimester: Secondary | ICD-10-CM

## 2018-02-25 DIAGNOSIS — R740 Nonspecific elevation of levels of transaminase and lactic acid dehydrogenase [LDH]: Secondary | ICD-10-CM | POA: Diagnosis not present

## 2018-02-25 DIAGNOSIS — R7401 Elevation of levels of liver transaminase levels: Secondary | ICD-10-CM

## 2018-02-25 NOTE — Telephone Encounter (Signed)
Preadmission screen  

## 2018-02-25 NOTE — Progress Notes (Signed)
   PRENATAL VISIT NOTE  Subjective:  Kristina Brown is a 22 y.o. G1P0 at 6330w0d being seen today for ongoing prenatal care.  She is currently monitored for the following issues for this low-risk pregnancy and has History of depression; Encounter for supervision of normal first pregnancy in second trimester; Late prenatal care affecting pregnancy in second trimester; and Elevated transaminase level on their problem list.  Patient reports occasional contractions.  Contractions: Irregular. Vag. Bleeding: None.  Movement: Present. Denies leaking of fluid.   The following portions of the patient's history were reviewed and updated as appropriate: allergies, current medications, past family history, past medical history, past social history, past surgical history and problem list. Problem list updated.  Objective:   Vitals:   02/25/18 0929  BP: 135/90  Pulse: (!) 130  Weight: 195 lb (88.5 kg)    Fetal Status: Fetal Heart Rate (bpm): 146 Fundal Height: 39 cm Movement: Present  Presentation: Vertex  General:  Alert, oriented and cooperative. Patient is in no acute distress.  Skin: Skin is warm and dry. No rash noted.   Cardiovascular: Normal heart rate noted  Respiratory: Normal respiratory effort, no problems with respiration noted  Abdomen: Soft, gravid, appropriate for gestational age.  Pain/Pressure: Present     Pelvic: Cervical exam performed Dilation: 1.5 Effacement (%): 60 Station: -2  Extremities: Normal range of motion.  Edema: None  Mental Status:  Normal mood and affect. Normal behavior. Normal judgment and thought content.   Assessment and Plan:  Pregnancy: G1P0 at 930w0d  1. Encounter for supervision of normal first pregnancy in second trimester FHT and FH normal. Patient would like to be induced at 40wks.  2. Elevated transaminase level Cholelithiasis on US last week. No pain since last visit.  - Hepatic function panel  3. Elevated BP Pt to stop by tomorrow and check  BP.  Preterm labor symptoms and general obstetric precautions including but not limited to vaginal bleeding, contractions, leaking of fluid and fetal movement were reviewed in detail with the patient. Please refer to After Visit Summary for other counseling recommendations.  No follow-ups on file.   Levie HeritageJacob J Nathanael Krist, DO

## 2018-02-26 ENCOUNTER — Encounter: Payer: Self-pay | Admitting: Family Medicine

## 2018-02-26 ENCOUNTER — Ambulatory Visit (INDEPENDENT_AMBULATORY_CARE_PROVIDER_SITE_OTHER): Payer: Medicaid Other | Admitting: Family Medicine

## 2018-02-26 VITALS — BP 132/87 | HR 88 | Wt 195.0 lb

## 2018-02-26 DIAGNOSIS — R030 Elevated blood-pressure reading, without diagnosis of hypertension: Secondary | ICD-10-CM

## 2018-02-26 LAB — HEPATIC FUNCTION PANEL
ALBUMIN: 3.7 g/dL (ref 3.5–5.5)
ALT: 9 IU/L (ref 0–32)
AST: 14 IU/L (ref 0–40)
Alkaline Phosphatase: 162 IU/L — ABNORMAL HIGH (ref 39–117)
BILIRUBIN TOTAL: 0.3 mg/dL (ref 0.0–1.2)
Bilirubin, Direct: 0.1 mg/dL (ref 0.00–0.40)
TOTAL PROTEIN: 6.3 g/dL (ref 6.0–8.5)

## 2018-02-26 NOTE — Progress Notes (Signed)
Chart reviewed - agree with RN documentation.   

## 2018-02-26 NOTE — Progress Notes (Signed)
Patient presents for blood pressure recheck. Patient denies any headache dizziness or blurry vision. Consulted with Dr. Adrian BlackwaterStinson and he states we will keep plan for induction next week. Armandina StammerJennifer Kyree Adriano RNBSN

## 2018-03-04 ENCOUNTER — Inpatient Hospital Stay (HOSPITAL_COMMUNITY)
Admission: RE | Admit: 2018-03-04 | Payer: Medicaid Other | Source: Ambulatory Visit | Admitting: Obstetrics & Gynecology

## 2018-03-05 ENCOUNTER — Encounter (HOSPITAL_COMMUNITY): Payer: Self-pay

## 2018-03-05 ENCOUNTER — Inpatient Hospital Stay (HOSPITAL_COMMUNITY): Payer: Medicaid Other | Admitting: Anesthesiology

## 2018-03-05 ENCOUNTER — Inpatient Hospital Stay (HOSPITAL_COMMUNITY)
Admission: RE | Admit: 2018-03-05 | Discharge: 2018-03-07 | DRG: 807 | Disposition: A | Discharge status: Home / Self Care | Payer: Medicaid Other | Source: Ambulatory Visit | Attending: Family Medicine | Admitting: Family Medicine

## 2018-03-05 DIAGNOSIS — E669 Obesity, unspecified: Secondary | ICD-10-CM | POA: Diagnosis present

## 2018-03-05 DIAGNOSIS — O9902 Anemia complicating childbirth: Secondary | ICD-10-CM | POA: Diagnosis present

## 2018-03-05 DIAGNOSIS — Z3A40 40 weeks gestation of pregnancy: Secondary | ICD-10-CM

## 2018-03-05 DIAGNOSIS — D649 Anemia, unspecified: Secondary | ICD-10-CM | POA: Diagnosis present

## 2018-03-05 DIAGNOSIS — O480 Post-term pregnancy: Secondary | ICD-10-CM | POA: Diagnosis present

## 2018-03-05 DIAGNOSIS — O164 Unspecified maternal hypertension, complicating childbirth: Secondary | ICD-10-CM | POA: Diagnosis not present

## 2018-03-05 DIAGNOSIS — O99214 Obesity complicating childbirth: Secondary | ICD-10-CM | POA: Diagnosis present

## 2018-03-05 LAB — COMPREHENSIVE METABOLIC PANEL
ALT: 9 U/L — ABNORMAL LOW (ref 14–54)
ANION GAP: 11 (ref 5–15)
AST: 15 U/L (ref 15–41)
Albumin: 3 g/dL — ABNORMAL LOW (ref 3.5–5.0)
Alkaline Phosphatase: 146 U/L — ABNORMAL HIGH (ref 38–126)
BILIRUBIN TOTAL: 0.6 mg/dL (ref 0.3–1.2)
BUN: 7 mg/dL (ref 6–20)
CO2: 18 mmol/L — ABNORMAL LOW (ref 22–32)
Calcium: 8.5 mg/dL — ABNORMAL LOW (ref 8.9–10.3)
Chloride: 105 mmol/L (ref 101–111)
Creatinine, Ser: 0.44 mg/dL (ref 0.44–1.00)
Glucose, Bld: 80 mg/dL (ref 65–99)
POTASSIUM: 4.1 mmol/L (ref 3.5–5.1)
Sodium: 134 mmol/L — ABNORMAL LOW (ref 135–145)
TOTAL PROTEIN: 6.8 g/dL (ref 6.5–8.1)

## 2018-03-05 LAB — CBC
HCT: 34.5 % — ABNORMAL LOW (ref 36.0–46.0)
HCT: 33.9 % — ABNORMAL LOW (ref 36.0–46.0)
HEMOGLOBIN: 11.9 g/dL — AB (ref 12.0–15.0)
Hemoglobin: 11.7 g/dL — ABNORMAL LOW (ref 12.0–15.0)
MCH: 29.7 pg (ref 26.0–34.0)
MCH: 29.8 pg (ref 26.0–34.0)
MCHC: 34.5 g/dL (ref 30.0–36.0)
MCHC: 34.5 g/dL (ref 30.0–36.0)
MCV: 86 fL (ref 78.0–100.0)
MCV: 86.3 fL (ref 78.0–100.0)
Platelets: 256 10*3/uL (ref 150–400)
Platelets: 231 10*3/uL (ref 150–400)
RBC: 4.01 MIL/uL (ref 3.87–5.11)
RBC: 3.93 MIL/uL (ref 3.87–5.11)
RDW: 13.7 % (ref 11.5–15.5)
RDW: 13.5 % (ref 11.5–15.5)
WBC: 12.1 10*3/uL — AB (ref 4.0–10.5)
WBC: 16.6 10*3/uL — ABNORMAL HIGH (ref 4.0–10.5)

## 2018-03-05 LAB — ABO/RH: ABO/RH(D): A POS

## 2018-03-05 LAB — TYPE AND SCREEN
ABO/RH(D): A POS
ANTIBODY SCREEN: NEGATIVE

## 2018-03-05 MED ORDER — OXYTOCIN BOLUS FROM INFUSION
500.0000 mL | Freq: Once | INTRAVENOUS | Status: AC
  Administered 2018-03-06: 500 mL via INTRAVENOUS

## 2018-03-05 MED ORDER — LACTATED RINGERS IV SOLN
INTRAVENOUS | Status: DC
  Administered 2018-03-05 – 2018-03-06 (×5): via INTRAVENOUS

## 2018-03-05 MED ORDER — LACTATED RINGERS IV SOLN
INTRAVENOUS | Status: DC
  Administered 2018-03-05: 300 mL via INTRAUTERINE
  Administered 2018-03-06: 04:00:00 via INTRAUTERINE

## 2018-03-05 MED ORDER — DIPHENHYDRAMINE HCL 50 MG/ML IJ SOLN: 12.5000 mg | INTRAMUSCULAR | Status: DC | PRN

## 2018-03-05 MED ORDER — SOD CITRATE-CITRIC ACID 500-334 MG/5ML PO SOLN: 30.0000 mL | ORAL | Status: DC | PRN

## 2018-03-05 MED ORDER — LACTATED RINGERS IV SOLN: 500.0000 mL | Freq: Once | INTRAVENOUS | Status: DC

## 2018-03-05 MED ORDER — MISOPROSTOL 25 MCG QUARTER TABLET
25.0000 ug | ORAL_TABLET | ORAL | Status: DC
  Administered 2018-03-05: 25 ug via BUCCAL
  Filled 2018-03-05 (×2): qty 1

## 2018-03-05 MED ORDER — ONDANSETRON HCL 4 MG/2ML IJ SOLN: 4.0000 mg | Freq: Four times a day (QID) | INTRAMUSCULAR | Status: DC | PRN

## 2018-03-05 MED ORDER — EPHEDRINE 5 MG/ML INJ
10.0000 mg | INTRAVENOUS | Status: DC | PRN
  Filled 2018-03-05: qty 2

## 2018-03-05 MED ORDER — OXYTOCIN 40 UNITS IN LACTATED RINGERS INFUSION - SIMPLE MED
2.5000 [IU]/h | INTRAVENOUS | Status: DC
  Filled 2018-03-05: qty 1000

## 2018-03-05 MED ORDER — MISOPROSTOL 50MCG HALF TABLET
50.0000 ug | ORAL_TABLET | ORAL | Status: DC
  Filled 2018-03-05 (×2): qty 1

## 2018-03-05 MED ORDER — TERBUTALINE SULFATE 1 MG/ML IJ SOLN
0.2500 mg | Freq: Once | INTRAMUSCULAR | Status: DC | PRN
  Filled 2018-03-05: qty 1

## 2018-03-05 MED ORDER — LACTATED RINGERS IV SOLN
500.0000 mL | INTRAVENOUS | Status: DC | PRN
  Administered 2018-03-05 – 2018-03-06 (×3): 500 mL via INTRAVENOUS

## 2018-03-05 MED ORDER — ACETAMINOPHEN 325 MG PO TABS
650.0000 mg | ORAL_TABLET | ORAL | Status: DC | PRN
Start: 2018-03-05 — End: 2018-03-06

## 2018-03-05 MED ORDER — OXYCODONE-ACETAMINOPHEN 5-325 MG PO TABS: 2.0000 | ORAL_TABLET | ORAL | Status: DC | PRN

## 2018-03-05 MED ORDER — PHENYLEPHRINE 40 MCG/ML (10ML) SYRINGE FOR IV PUSH (FOR BLOOD PRESSURE SUPPORT)
80.0000 ug | PREFILLED_SYRINGE | INTRAVENOUS | Status: DC | PRN
  Filled 2018-03-05: qty 5

## 2018-03-05 MED ORDER — OXYTOCIN 40 UNITS IN LACTATED RINGERS INFUSION - SIMPLE MED
INTRAVENOUS | Status: AC
  Filled 2018-03-05: qty 1000

## 2018-03-05 MED ORDER — LIDOCAINE HCL (PF) 1 % IJ SOLN
INTRAMUSCULAR | Status: DC | PRN
  Administered 2018-03-05: 2 mL via EPIDURAL
  Administered 2018-03-05: 5 mL via EPIDURAL
  Administered 2018-03-05: 3 mL via EPIDURAL

## 2018-03-05 MED ORDER — LACTATED RINGERS IV SOLN
500.0000 mL | Freq: Once | INTRAVENOUS | Status: AC
  Administered 2018-03-05: 1000 mL via INTRAVENOUS

## 2018-03-05 MED ORDER — OXYCODONE-ACETAMINOPHEN 5-325 MG PO TABS: 1.0000 | ORAL_TABLET | ORAL | Status: DC | PRN

## 2018-03-05 MED ORDER — FENTANYL 2.5 MCG/ML BUPIVACAINE 1/10 % EPIDURAL INFUSION (WH - ANES)
14.0000 mL/h | INTRAMUSCULAR | Status: DC | PRN
  Administered 2018-03-05 – 2018-03-06 (×2): 14 mL/h via EPIDURAL
  Filled 2018-03-05 (×2): qty 100

## 2018-03-05 MED ORDER — FENTANYL CITRATE (PF) 100 MCG/2ML IJ SOLN
100.0000 ug | INTRAMUSCULAR | Status: DC | PRN
  Administered 2018-03-05: 100 ug via INTRAVENOUS
  Filled 2018-03-05: qty 2

## 2018-03-05 MED ORDER — PHENYLEPHRINE 40 MCG/ML (10ML) SYRINGE FOR IV PUSH (FOR BLOOD PRESSURE SUPPORT)
80.0000 ug | PREFILLED_SYRINGE | INTRAVENOUS | Status: DC | PRN
  Filled 2018-03-05: qty 10
  Filled 2018-03-05: qty 5

## 2018-03-05 MED ORDER — LIDOCAINE HCL (PF) 1 % IJ SOLN
30.0000 mL | INTRAMUSCULAR | Status: DC | PRN
  Administered 2018-03-06: 30 mL via SUBCUTANEOUS
  Filled 2018-03-05: qty 30

## 2018-03-05 NOTE — Anesthesia Procedure Notes (Signed)
Epidural Patient location during procedure: OB Start time: 03/05/2018 7:46 PM End time: 03/05/2018 7:51 PM  Staffing Anesthesiologist: Cecile Hearingurk, Stephen Edward, MD Performed: anesthesiologist   Preanesthetic Checklist Completed: patient identified, pre-op evaluation, timeout performed, IV checked, risks and benefits discussed and monitors and equipment checked  Epidural Patient position: sitting Prep: DuraPrep Patient monitoring: blood pressure and continuous pulse ox Approach: midline Location: L3-L4 Injection technique: LOR air  Needle:  Needle type: Tuohy  Needle gauge: 17 G Needle length: 9 cm Needle insertion depth: 6 cm Catheter size: 19 Gauge Catheter at skin depth: 11 cm Test dose: negative and Other (1% Lidocaine)  Additional Notes Patient identified.  Risk benefits discussed including failed block, incomplete pain control, headache, nerve damage, paralysis, blood pressure changes, nausea, vomiting, reactions to medication both toxic or allergic, and postpartum back pain.  Patient expressed understanding and wished to proceed.  All questions were answered.  Sterile technique used throughout procedure and epidural site dressed with sterile barrier dressing. No paresthesia or other complications noted. The patient did not experience any signs of intravascular injection such as tinnitus or metallic taste in mouth nor signs of intrathecal spread such as rapid motor block. Please see nursing notes for vital signs. Reason for block:procedure for pain

## 2018-03-05 NOTE — Progress Notes (Signed)
Subjective: Doing well, does feel like contractions are getting stronger after foley bulb. Otherwise no change.   Objective: BP 109/70   Pulse 81   Temp 98.5 F (36.9 C) (Oral)   Resp 18   Ht 5\' 5"  (1.651 m)   Wt 89.8 kg (198 lb 0.6 oz)   LMP 05/28/2017 (Exact Date)   BMI 32.96 kg/m  No intake/output data recorded. No intake/output data recorded.  FHT:  FHR: 145 bpm, variability: minimal ,  accelerations:  Abscent,  decelerations:  Absent UC:   none SVE:   Dilation: 2 Effacement (%): 50 Station: -2 Exam by:: Enis SlipperJane Bailey, RN  Labs: Lab Results  Component Value Date   WBC 12.1 (H) 03/05/2018   HGB 11.9 (L) 03/05/2018   HCT 34.5 (L) 03/05/2018   MCV 86.0 03/05/2018   PLT 256 03/05/2018    Assessment / Plan: Induction of labor due to postterm,  progressing on cytotec. No issues at current time.  Labor: Progressing normally, due for 2nd dose of cytotec Preeclampsia:  na Fetal Wellbeing:  Category I Pain Control:  Labor support without medications I/D:  n/a Anticipated MOD:  NSVD  Myrene BuddyJacob Fahd Galea 03/05/2018, 2:42 PM

## 2018-03-05 NOTE — Progress Notes (Addendum)
LABOR PROGRESS NOTE  Subjective:  Patient seen and examined for progress of labor. Patient comfortable with IV pain meds. Feels contractions.  Objective:  Vitals:   03/05/18 2031 03/05/18 2101 03/05/18 2131 03/05/18 2155  BP: (!) 134/59 130/69 138/70   Pulse: 88 84 94   Resp: 18 18 16    Temp: 99 F (37.2 C)   98.7 F (37.1 C)  TempSrc: Oral   Axillary  Weight:      Height:       Dilation: 8 Effacement (%): 90 Cervical Position: Anterior Station: -1 Presentation: Vertex Exam by:: Deloma Spindle, DO FHT: 150 bpm, moderate variability, accelerations present, variable decelerations TOCO: irregular, every 3-5 minutes  Assessment/Plan: 22 y.o. G1P0 at 5735w1d here for IOL  Labor: stage 1, having frequent variable decelerations, will proceed with IUPC Preeclampsia: N/A Fetal wellbeing: category 2, will give amnioinfusion given frequent variables Pain control: IV pain meds, may have epidural I/D: neg Anticipated MOD: continue expectant management, anticipate SVD  Kristina Parcelavid Mckinzy Fuller, DO, PGY-2 03/05/2018, 10:26 PM

## 2018-03-05 NOTE — Anesthesia Preprocedure Evaluation (Signed)
Anesthesia Evaluation  Patient identified by MRN, date of birth, ID band Patient awake    Reviewed: Allergy & Precautions, NPO status , Patient's Chart, lab work & pertinent test results  Airway Mallampati: II  TM Distance: >3 FB Neck ROM: Full    Dental  (+) Teeth Intact, Dental Advisory Given   Pulmonary neg pulmonary ROS,    Pulmonary exam normal breath sounds clear to auscultation       Cardiovascular hypertension (PIH), Normal cardiovascular exam Rhythm:Regular Rate:Normal     Neuro/Psych negative neurological ROS     GI/Hepatic negative GI ROS, Neg liver ROS,   Endo/Other  Obesity   Renal/GU negative Renal ROS     Musculoskeletal negative musculoskeletal ROS (+)   Abdominal   Peds  Hematology negative hematology ROS (+) Blood dyscrasia, anemia , Plt 231k   Anesthesia Other Findings Day of surgery medications reviewed with the patient.  Reproductive/Obstetrics (+) Pregnancy                             Anesthesia Physical Anesthesia Plan  ASA: III  Anesthesia Plan: Epidural   Post-op Pain Management:    Induction:   PONV Risk Score and Plan: 2 and Treatment may vary due to age or medical condition  Airway Management Planned:   Additional Equipment:   Intra-op Plan:   Post-operative Plan:   Informed Consent: I have reviewed the patients History and Physical, chart, labs and discussed the procedure including the risks, benefits and alternatives for the proposed anesthesia with the patient or authorized representative who has indicated his/her understanding and acceptance.   Dental advisory given  Plan Discussed with:   Anesthesia Plan Comments: (Patient identified. Risks/Benefits/Options discussed with patient including but not limited to bleeding, infection, nerve damage, paralysis, failed block, incomplete pain control, headache, blood pressure changes, nausea,  vomiting, reactions to medication both or allergic, itching and postpartum back pain. Confirmed with bedside nurse the patient's most recent platelet count. Confirmed with patient that they are not currently taking any anticoagulation, have any bleeding history or any family history of bleeding disorders. Patient expressed understanding and wished to proceed. All questions were answered. )        Anesthesia Quick Evaluation

## 2018-03-05 NOTE — Anesthesia Pain Management Evaluation Note (Signed)
  CRNA Pain Management Visit Note  Patient: Kristina Brown, 22 y.o., female  "Hello I am a member of the anesthesia team at [facility]. We have an anesthesia team available at all times to provide care throughout the hospital, including epidural management and anesthesia for C-section. I don't know your plan for the delivery whether it a natural birth, water birth, IV sedation, nitrous supplementation, doula or epidural, but we want to meet your pain goals."   1.Was your pain managed to your expectations on prior hospitalizations?   No prior hospitalizations  2.What is your expectation for pain management during this hospitalization?     Epidural  3.How can we help you reach that goal? unsure  Record the patient's initial score and the patient's pain goal.   Pain: 1  Pain Goal: 5 [facility] wants you to be able to say your pain was always managed very well.  Cephus ShellingBURGER,Suhailah Kwan 03/05/2018

## 2018-03-05 NOTE — Progress Notes (Signed)
LABOR PROGRESS NOTE  Kristina Brown is a 22 y.o. G1P0 at 8262w1d  admitted for IOL   Subjective: Patient doing well. Pain controlled with IV pain meds  Objective: BP (!) 132/94 (BP Location: Right Arm)   Pulse 63   Temp 98.3 F (36.8 C) (Oral)   Resp 20   Ht 5\' 5"  (1.651 m)   Wt 198 lb 0.6 oz (89.8 kg)   LMP 05/28/2017 (Exact Date)   BMI 32.96 kg/m  or  Vitals:   03/05/18 1406 03/05/18 1454 03/05/18 1600 03/05/18 1716  BP: 109/70 114/68 118/72 (!) 132/94  Pulse: 81 87 98 63  Resp: 18  20 20   Temp:   98.3 F (36.8 C)   TempSrc:   Oral   Weight:      Height:        SVE: Dilation: 6.5 Effacement (%): 90 Cervical Position: Anterior Station: -2, -1 Presentation: Vertex Exam by:: Dr. Nira Retortegele FHT: baseline rate 145, moderate varibility, +acel, no decel Toco: ctx q 3-5 min  Assessment / Plan: 22 y.o. G1P0 at 2562w1d here for IOL  Labor: AROM, clear fluid. Expectant managemen Fetal Wellbeing:  Cat I Pain Control:  IV pain meds; may have epidural Anticipated MOD:  SVD  Frederik PearJulie P Juandavid Dallman, MD 03/05/2018, 6:36 PM

## 2018-03-05 NOTE — H&P (Addendum)
OBSTETRIC ADMISSION HISTORY AND PHYSICAL  Kristina Brown is a 22 y.o. female G1P0 with IUP at [redacted]w[redacted]d by LMP presenting for induction of labor. She reports +FMs, No LOF, no VB, no blurry vision, headaches or peripheral edema, and RUQ pain.  She plans on breast feeding. She thinks she wants to do an IUD for birth control. She received her prenatal care at [facility]   Dating: 28.2 By LMP --->  Estimated Date of Delivery: 03/04/18  Sono:   @[redacted]w[redacted]d , CWD, normal anatomy, cephalic presentation   Clinic  [facility] Prenatal Labs  Dating  21 wk [place] Blood type: A/Positive/-- (11/20 1004)   Genetic Screen 1 Screen:    AFP:     Quad:     NIPS: Antibody:Negative (11/20 1004)  Anatomic [place]  normal Rubella: 2.15 (11/20 1004)  GTT  Third trimester: WNL RPR: Non Reactive (11/20 1004)   Flu vaccine  Already had one HBsAg: Negative (11/20 1004)   TDaP vaccine              12/31/2017                                  HIV:  NR  Baby Food  Breast                                 GBS: (For PCN allergy, check sensitivities)  Contraception  Mirena IUD Pap:  Circumcision  yes   Pediatrician  [facility] or [facility]:  Support Person  FOB SMA  Prenatal Classes  Hgb electrophoresis:     Prenatal History/Complications:  Past Medical History: Past Medical History:  Diagnosis Date  . Medical history non-contributory   . Pregnancy induced hypertension     Past Surgical History: Past Surgical History:  Procedure Laterality Date  . NO PAST SURGERIES      Obstetrical History: OB History    Gravida  1   Para      Term      Preterm      AB      Living        SAB      TAB      Ectopic      Multiple      Live Births              Social History: Social History   Socioeconomic History  . Marital status: Single    Spouse name: Not on file  . Number of children: Not on file  . Years of education: Not on file  . Highest education level: Not on file  Occupational History  . Not on  file  Social Needs  . Financial resource strain: Not on file  . Food insecurity:    Worry: Not on file    Inability: Not on file  . Transportation needs:    Medical: Not on file    Non-medical: Not on file  Tobacco Use  . Smoking status: Never Smoker  . Smokeless tobacco: Never Used  Substance and Sexual Activity  . Alcohol use: No    Frequency: Never  . Drug use: No  . Sexual activity: Yes  Lifestyle  . Physical activity:    Days per week: Not on file    Minutes per session: Not on file  . Stress: Not on file  Relationships  .  Social connections:    Talks on phone: Not on file    Gets together: Not on file    Attends religious service: Not on file    Active member of club or organization: Not on file    Attends meetings of clubs or organizations: Not on file    Relationship status: Not on file  Other Topics Concern  . Not on file  Social History Narrative  . Not on file    Family History: Family History  Problem Relation Age of Onset  . Diabetes Mother   . Stroke Maternal Grandmother   . Cancer Paternal Grandmother        breast  . Birth defects Neg Hx   . Hypertension Neg Hx     Allergies: Allergies  Allergen Reactions  . Latex Rash    Medications Prior to Admission  Medication Sig Dispense Refill Last Dose  . PRENATAL 27-1 MG TABS Take 1 tablet daily by mouth.  0 03/04/2018 at Unknown time     Review of Systems   All systems reviewed and negative except as stated in HPI  Blood pressure 115/77, pulse (!) 103, temperature 98.6 F (37 C), temperature source Oral, resp. rate 18, height 5\' 5"  (1.651 m), weight 89.8 kg (198 lb 0.6 oz), last menstrual period 05/28/2017. General appearance: alert and cooperative Lungs: clear to auscultation bilaterally Heart: regular rate and rhythm Abdomen: soft, non-tender; bowel sounds normal Extremities: Homans sign is negative, no sign of DVT Presentation: cephalic Fetal monitoringBaseline: 145 bpm Uterine  activity only cramping Dilation: 2 Effacement (%): 50 Station: -2 Exam by:: Dr. Nira Retortegele   Prenatal labs: ABO, Rh: A/Positive/-- (11/20 1004) Antibody: Negative (11/20 1004) Rubella: 2.15 (11/20 1004) RPR: Non Reactive (12/31 0900)  HBsAg: Negative (11/20 1004)  HIV: Non Reactive (12/31 0900)  GBS: Negative (03/05 0000)  1 hr Glucola 100 Genetic screening  normal Anatomy US normal  Prenatal Transfer Tool  Maternal Diabetes: No Genetic Screening: Normal Maternal Ultrasounds/Referrals: Normal Fetal Ultrasounds or other Referrals:  None Maternal Substance Abuse:  No Significant Maternal Medications:  None Significant Maternal Lab Results: Lab values include: Other: alkaline phosphatase 162.  No results found for this or any previous visit (from the past 24 hour(s)).  Patient Active Problem List   Diagnosis Date Noted  . Post term pregnancy at [redacted] weeks gestation 03/05/2018  . Elevated transaminase level 02/19/2018  . History of depression 10/23/2017  . Encounter for supervision of normal first pregnancy in second trimester 10/23/2017  . Late prenatal care affecting pregnancy in second trimester 10/23/2017    Assessment/Plan:  Kristina Brown is a 22 y.o. G1P0 at 8947w1d here for IOL. Doing well with no active contractions. Foley bulb placed, starting cytotec.Tolerated placement of foley bulb well. Previously with cholelithiasis. Only had pain for one night, has been doing well from this standpoint since.  Labor: Plan for cytotec  buccal. Foley bulb placed #Pain:  Per maternal preference #FWB: Category I #ID: GBS negative #MOF: breast #MOC: IUD #Circ:   N/A   Myrene BuddyJacob Fletcher, MD  03/05/2018, 9:46 AM   OB FELLOW HISTORY AND PHYSICAL ATTESTATION  I have seen and examined this patient; I agree with above documentation in the resident's note.   Pt here for IOL after 40 weeks. Pregnancy complicated by cholelithiasis, resolved. LFTs wnl on admission. Cytotec and FB for  cervical repeing FHT Cat I  Frederik PearJulie P Shaquella Stamant, MD OB Fellow 03/05/2018

## 2018-03-06 ENCOUNTER — Encounter (HOSPITAL_COMMUNITY): Payer: Self-pay

## 2018-03-06 DIAGNOSIS — Z3A40 40 weeks gestation of pregnancy: Secondary | ICD-10-CM

## 2018-03-06 LAB — RPR: RPR Ser Ql: NONREACTIVE

## 2018-03-06 MED ORDER — WITCH HAZEL-GLYCERIN EX PADS: 1.0000 "application " | MEDICATED_PAD | CUTANEOUS | Status: DC | PRN

## 2018-03-06 MED ORDER — ONDANSETRON HCL 4 MG PO TABS: 4.0000 mg | ORAL_TABLET | ORAL | Status: DC | PRN

## 2018-03-06 MED ORDER — IBUPROFEN 600 MG PO TABS
600.0000 mg | ORAL_TABLET | Freq: Four times a day (QID) | ORAL | Status: DC
  Administered 2018-03-06 – 2018-03-07 (×6): 600 mg via ORAL
  Filled 2018-03-06 (×6): qty 1

## 2018-03-06 MED ORDER — PRENATAL MULTIVITAMIN CH
1.0000 | ORAL_TABLET | Freq: Every day | ORAL | Status: DC
  Administered 2018-03-06 – 2018-03-07 (×2): 1 via ORAL
  Filled 2018-03-06 (×2): qty 1

## 2018-03-06 MED ORDER — TETANUS-DIPHTH-ACELL PERTUSSIS 5-2.5-18.5 LF-MCG/0.5 IM SUSP: 0.5000 mL | Freq: Once | INTRAMUSCULAR | Status: DC

## 2018-03-06 MED ORDER — SIMETHICONE 80 MG PO CHEW: 80.0000 mg | CHEWABLE_TABLET | ORAL | Status: DC | PRN

## 2018-03-06 MED ORDER — TERBUTALINE SULFATE 1 MG/ML IJ SOLN
0.2500 mg | Freq: Once | INTRAMUSCULAR | Status: DC | PRN
  Filled 2018-03-06: qty 1

## 2018-03-06 MED ORDER — ZOLPIDEM TARTRATE 5 MG PO TABS: 5.0000 mg | ORAL_TABLET | Freq: Every evening | ORAL | Status: DC | PRN

## 2018-03-06 MED ORDER — SENNOSIDES-DOCUSATE SODIUM 8.6-50 MG PO TABS
2.0000 | ORAL_TABLET | ORAL | Status: DC
  Administered 2018-03-07: 2 via ORAL
  Filled 2018-03-06 (×2): qty 2

## 2018-03-06 MED ORDER — DIPHENHYDRAMINE HCL 25 MG PO CAPS: 25.0000 mg | ORAL_CAPSULE | Freq: Four times a day (QID) | ORAL | Status: DC | PRN

## 2018-03-06 MED ORDER — OXYTOCIN 40 UNITS IN LACTATED RINGERS INFUSION - SIMPLE MED
1.0000 m[IU]/min | INTRAVENOUS | Status: DC
  Administered 2018-03-06: 2 m[IU]/min via INTRAVENOUS

## 2018-03-06 MED ORDER — COCONUT OIL OIL: 1.0000 "application " | TOPICAL_OIL | Status: DC | PRN

## 2018-03-06 MED ORDER — DIBUCAINE 1 % RE OINT: 1.0000 "application " | TOPICAL_OINTMENT | RECTAL | Status: DC | PRN

## 2018-03-06 MED ORDER — ONDANSETRON HCL 4 MG/2ML IJ SOLN: 4.0000 mg | INTRAMUSCULAR | Status: DC | PRN

## 2018-03-06 MED ORDER — BENZOCAINE-MENTHOL 20-0.5 % EX AERO
1.0000 "application " | INHALATION_SPRAY | CUTANEOUS | Status: DC | PRN
  Administered 2018-03-06: 1 via TOPICAL
  Filled 2018-03-06: qty 56

## 2018-03-06 MED ORDER — ACETAMINOPHEN 325 MG PO TABS
650.0000 mg | ORAL_TABLET | ORAL | Status: DC | PRN
  Administered 2018-03-06: 650 mg via ORAL
  Filled 2018-03-06: qty 2

## 2018-03-06 NOTE — Progress Notes (Signed)
LABOR PROGRESS NOTE  Subjective:  Patient seen and examined for progress of labor. Patient comfortable with epidural.   Objective:  Vitals:   03/06/18 0001 03/06/18 0031 03/06/18 0100 03/06/18 0131  BP: (!) 147/93 (!) 144/85 134/90 (!) 113/53  Pulse: (!) 110 96 (!) 251 97  Resp: 16 18 18 16   Temp:      TempSrc:      Weight:      Height:       Dilation: 9 Effacement (%): 100 Cervical Position: Anterior Station: 0, Plus 1 Presentation: Vertex Exam by:: Clare CharonKristin Hutchison, RN FHT: 150 bpm, minimal variability, accelerations present, variable decelerations TOCO: regular, every 3 minutes  Assessment/Plan: 22 y.o. G1P0 at 5781w1d here for IOL. Making cervical change with IUPC and amnioinfusion though continues to have intermittent runs of variable decelerations with minimal variability. Will trial off pit and monitor fetal strip.  Labor: stage 2 Preeclampsia: N/A Fetal wellbeing: category 2 Pain control: epidural I/D: neg Anticipated MOD: continue expectant management, anticipate NSVD  Durward Parcelavid Keilynn Marano, DO, PGY-2 03/06/2018, 2:20 AM

## 2018-03-06 NOTE — Anesthesia Postprocedure Evaluation (Signed)
Anesthesia Post Note  Patient: Kristina Brown  Procedure(s) Performed: AN AD HOC LABOR EPIDURAL     Patient location during evaluation: Mother Baby Anesthesia Type: Epidural Level of consciousness: awake Pain management: satisfactory to patient Vital Signs Assessment: post-procedure vital signs reviewed and stable Respiratory status: spontaneous breathing Cardiovascular status: stable Anesthetic complications: no    Last Vitals:  Vitals:   03/06/18 0853 03/06/18 1254  BP: 118/68 117/66  Pulse: 85 94  Resp: 18 18  Temp: 37.3 C 37.3 C  SpO2: 99% 99%    Last Pain:  Vitals:   03/06/18 1254  TempSrc: Oral  PainSc: 2    Pain Goal: Patients Stated Pain Goal: 3 (03/06/18 1254)               Cephus ShellingBURGER,Shakyla Nolley

## 2018-03-06 NOTE — Lactation Note (Signed)
This note was copied from a baby's chart. Lactation Consultation Note  Patient Name: Girl Llana AlimentLauren Desch Today's Date: 03/06/2018 Reason for consult: Initial assessment;Term;[facility] Breastfeeding consultation services and support information given and reviewed.  Newborn is 8 hours old and has been to breast three time.  Baby is currently on breast in football hold.  Baby feeding actively with a few swallows noted.  Noted that mom has wide spaced tubular shaped breasts.  Very little breast changes in pregnancy.  Some tenderness.  Symphony pump has been initiated for stimulation.  Encouraged to call for assist/concerns prn.  Maternal Data Has patient been taught Hand Expression?: Yes Does the patient have breastfeeding experience prior to this delivery?: No  Feeding Feeding Type: Breast Milk Length of feed: 3 min  LATCH Score Latch: Grasps breast easily, tongue down, lips flanged, rhythmical sucking.  Audible Swallowing: A few with stimulation  Type of Nipple: Everted at rest and after stimulation  Comfort (Breast/Nipple): Soft / non-tender  Hold (Positioning): Assistance needed to correctly position infant at breast and maintain latch.  LATCH Score: 8  Interventions    Lactation Tools Discussed/Used Pump Review: Setup, frequency, and cleaning Initiated by:: Milagros Reaponna Esker, RN Date initiated:: 03/06/18   Consult Status Consult Status: Follow-up Date: 03/07/18 Follow-up type: In-patient    Huston FoleyMOULDEN, Menaal Russum S 03/06/2018, 1:31 PM

## 2018-03-06 NOTE — Progress Notes (Signed)
MOB was referred for history of depression/anxiety. * Referral screened out by Clinical Social Worker because none of the following criteria appear to apply: ~ History of anxiety/depression during this pregnancy, or of post-partum depression. ~ Diagnosis of anxiety and/or depression within last 3 years; Per MOB's OB record, MOB had depression as a teen and no symptoms currently.  OR * MOB's symptoms currently being treated with medication and/or therapy. Please contact the Clinical Social Worker if needs arise, by MOB request, or if MOB scores greater than 9/yes to question 10 on [place] Postpartum Depression Screen.  Reagyn Facemire Boyd-Gilyard, MSW, LCSW Clinical Social Work [phone] 

## 2018-03-07 MED ORDER — IBUPROFEN 600 MG PO TABS: 600.0000 mg | ORAL_TABLET | Freq: Four times a day (QID) | ORAL | 0 refills | Status: DC

## 2018-03-07 MED FILL — IBUPROFEN 600 MG TABLET: 600 | 8 days supply | Qty: 30 | Fill #0

## 2018-03-07 NOTE — Lactation Note (Signed)
This note was copied from a baby's chart. Lactation Consultation Note  Patient Name: Kristina Brown [id number]'s Date: 03/07/2018 Reason for consult: Follow-up assessment;Hyperbilirubinemia;Term;1st time breastfeeding;Infant weight loss   Follow up with mom of 30 hour old infant. Infant with 9 BF for 10-30 minutes, 1 BF attempt, EBM x 1 of 1 cc, 3 voids and 4 stools. Infant weight 6 pounds 8.8 ounces with 6% weight loss since birth.   Stephanie Acrerisha Glime RN reports there has been an order received to begin supplementing with 30 cc formula after BF.   Mom to pump and LC to return to assist mom with supplementing infant with curved tip syringe or 5 french feeding tube at the breast.   Mom has pump set up, she has not been pumping much. Will return within 30 minutes to assist mom.     Maternal Data Formula Feeding for Exclusion: No Has patient been taught Hand Expression?: Yes Does the patient have breastfeeding experience prior to this delivery?: No  Feeding Feeding Type: Breast Fed Length of feed: 30 min  LATCH Score Latch: Repeated attempts needed to sustain latch, nipple held in mouth throughout feeding, stimulation needed to elicit sucking reflex.  Audible Swallowing: Spontaneous and intermittent  Type of Nipple: Everted at rest and after stimulation  Comfort (Breast/Nipple): Soft / non-tender  Hold (Positioning): Assistance needed to correctly position infant at breast and maintain latch.  LATCH Score: 8  Interventions    Lactation Tools Discussed/Used WIC Program: Yes Pump Review: Setup, frequency, and cleaning;Milk Storage Initiated by:: Reviewed and encouraged every 2-3 hour post BF   Consult Status Consult Status: Follow-up Date: 03/07/18 Follow-up type: In-patient    [name] 03/07/2018, 12:16 PM

## 2018-03-07 NOTE — Discharge Summary (Addendum)
OB Discharge Summary     Patient Name: Kristina Brown DOB: 05-Jun-1996 MRN: [id number]  Date of admission: 03/05/2018 Delivering MD: Wendee BeaversMCMULLEN, DAVID J   Date of discharge: 03/07/2018  Admitting diagnosis: INDUCTION Intrauterine pregnancy: 8070w2d     Secondary diagnosis:  Active Problems:   NSVD (normal spontaneous vaginal delivery)  Additional problems: cholelithiasis (resolved)     Discharge diagnosis: Post Term Pregnancy Delivered                                                                                                Post partum procedures:None  Augmentation: Cytotec  Complications: None  Hospital course:  Induction of Labor With Vaginal Delivery   22 y.o. yo G1P1001 at 2570w2d was admitted to the hospital 03/05/2018 for induction of labor.  Indication for induction: Postdates.  Patient had an uncomplicated labor course as follows: Membrane Rupture Time/Date: 6:31 PM ,03/05/2018   Intrapartum Procedures: Episiotomy: None [1]                                         Lacerations:  Sulcus [9];Perineal [11];1st degree [2]  Patient had delivery of a Viable infant.  Information for the patient's newborn:  Esmeralda LinksGregory, Girl Sandra [[id number]][[id number]]  Delivery Method: Vaginal, Spontaneous(Filed from Delivery Summary)   03/06/2018  Details of delivery can be found in separate delivery note.  Patient had a routine postpartum course. Patient is discharged home 03/07/18.  Physical exam  Vitals:   03/06/18 0853 03/06/18 1254 03/06/18 1737 03/07/18 0544  BP: 118/68 117/66 112/62 113/74  Pulse: 85 94 95 (!) 107  Resp: 18 18 18 18   Temp: 99.1 F (37.3 C) 99.1 F (37.3 C) 98 F (36.7 C) 98 F (36.7 C)  TempSrc: Oral Oral Oral Oral  SpO2: 99% 99%  97%  Weight:      Height:       General: alert, cooperative and no distress Lochia: appropriate Uterine Fundus: firm Incision: N/A DVT Evaluation: No evidence of DVT seen on physical exam. Labs: Lab Results  Component Value Date   WBC 16.6  (H) 03/05/2018   HGB 11.7 (L) 03/05/2018   HCT 33.9 (L) 03/05/2018   MCV 86.3 03/05/2018   PLT 231 03/05/2018   CMP Latest Ref Rng & Units 03/05/2018  Glucose 65 - 99 mg/dL 80  BUN 6 - 20 mg/dL 7  Creatinine 7.820.44 - 9.561.00 mg/dL 2.130.44  Sodium 086135 - 578145 mmol/L 134(L)  Potassium 3.5 - 5.1 mmol/L 4.1  Chloride 101 - 111 mmol/L 105  CO2 22 - 32 mmol/L 18(L)  Calcium 8.9 - 10.3 mg/dL 4.6(N8.5(L)  Total Protein 6.5 - 8.1 g/dL 6.8  Total Bilirubin 0.3 - 1.2 mg/dL 0.6  Alkaline Phos 38 - 126 U/L 146(H)  AST 15 - 41 U/L 15  ALT 14 - 54 U/L 9(L)    Discharge instruction: per After Visit Summary and "Baby and Me Booklet".  After visit meds:    Diet: routine diet  Activity: Advance as tolerated.  Pelvic rest for 6 weeks.   Outpatient follow up:4 weeks Follow up Appt:No future appointments. Follow up Visit:No follow-ups on file.  Postpartum contraception: IUD Mirena  Newborn Data: Live born female  Birth Weight: 6 lb 14.9 oz (3144 g) APGAR: 9, 9  Newborn Delivery   Birth date/time:  03/06/2018 05:06:00 Delivery type:  Vaginal, Spontaneous     Baby Feeding: Breast Disposition:home with mother   03/07/2018 Myrene Buddy, MD  OB FELLOW DISCHARGE ATTESTATION  I have seen and examined this patient and agree with above documentation in the resident's note.   Frederik Pear, MD OB Fellow

## 2018-03-07 NOTE — Lactation Note (Signed)
This note was copied from a baby's chart. Lactation Consultation Note  Patient Name: Kristina Brown [id number]'s Date: 03/07/2018 Reason for consult: Follow-up assessment   Follow up with mom. Mom has pumped and obtained a few gtts on the flanges. Mom was a little tearful. Showed mom how to use the 5 french feeding tube at the breast and infant took 24 ml of formula at the breast with the 5 french feeding tube and 4 cc with the curved tip syringe after the BF. Showed mom how to use and clean both.   Mom to call out for feeding assistance as needed. Report to Jolayne Hainesricia Glime, RN.   Enc mom to feed infant at least every 3 hours using the 5 french tube at the breast with 30 ml of breast milk and/or formula. Mom to finish supplement with curved tip syringe if infant does not take at the breast. Mom to pump post BF and use EBM to supplement as available.   Formula storage reviewed with mom.   Maternal Data Formula Feeding for Exclusion: No Has patient been taught Hand Expression?: Yes Does the patient have breastfeeding experience prior to this delivery?: No  Feeding Feeding Type: Breast Fed Length of feed: 10 min  LATCH Score Latch: Repeated attempts needed to sustain latch, nipple held in mouth throughout feeding, stimulation needed to elicit sucking reflex.  Audible Swallowing: Spontaneous and intermittent  Type of Nipple: Everted at rest and after stimulation  Comfort (Breast/Nipple): Soft / non-tender  Hold (Positioning): Assistance needed to correctly position infant at breast and maintain latch.  LATCH Score: 8  Interventions    Lactation Tools Discussed/Used Tools: 91F feeding tube / Syringe WIC Program: Yes Pump Review: Setup, frequency, and cleaning;Milk Storage Initiated by:: Reviewed and encouraged every 2-3 hour post BF   Consult Status Consult Status: Follow-up Date: 03/08/18 Follow-up type: In-patient    Silas FloodSharon S Hice 03/07/2018, 12:53 PM

## 2018-03-10 IMAGING — US US MFM OB FOLLOW-UP
1 series · 14 of 28 positions shown · non-contrast
Comparison: none

[Series 1: us mfm ob follow-up · 42 acquisitions, 14 frames shown]
[im 2/42]
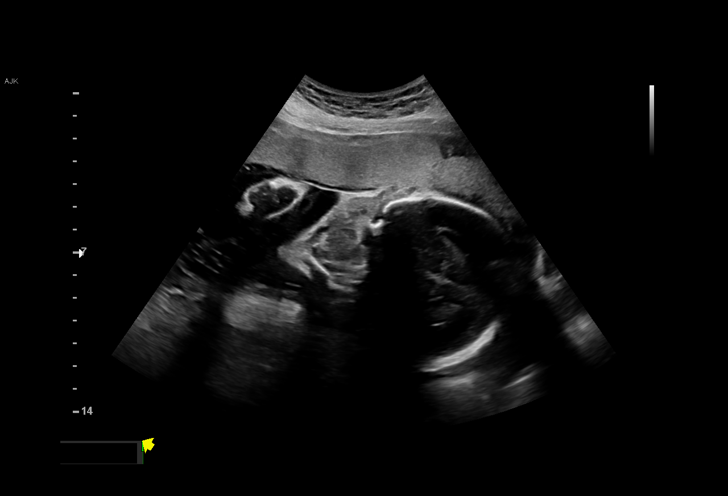
[im 5/42]
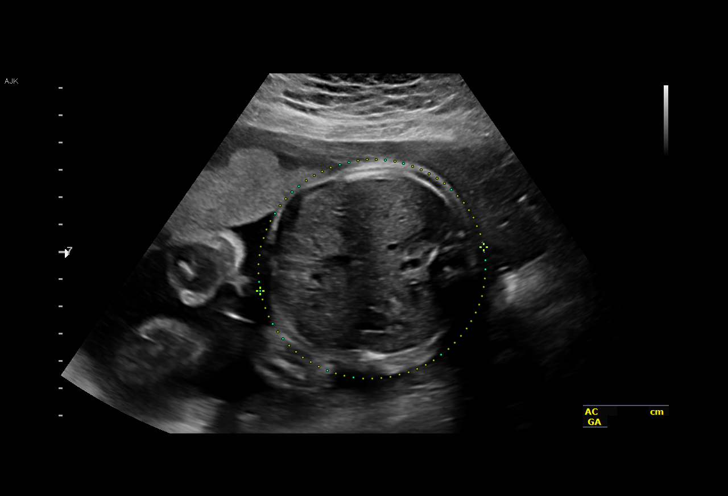
[im 8/42]
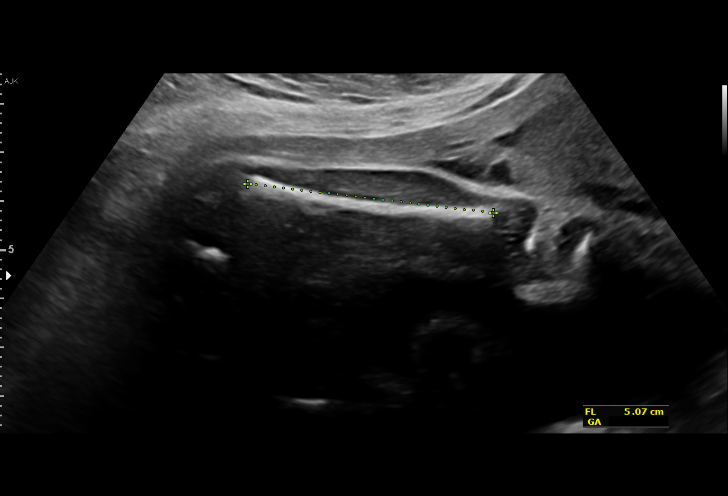
[im 11/42]
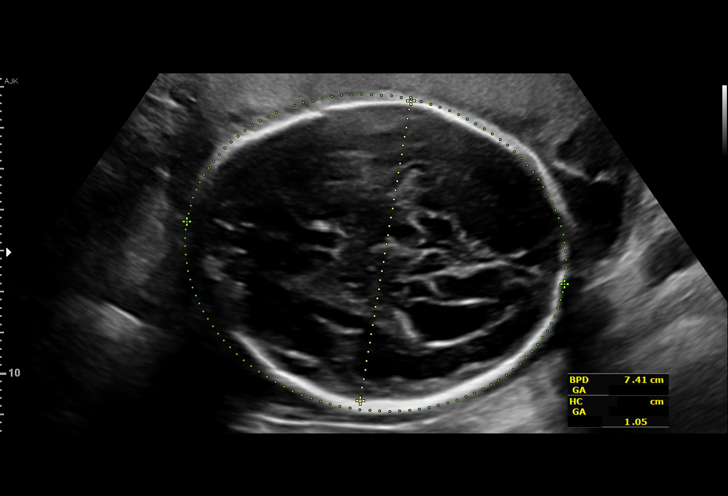
[im 14/42]
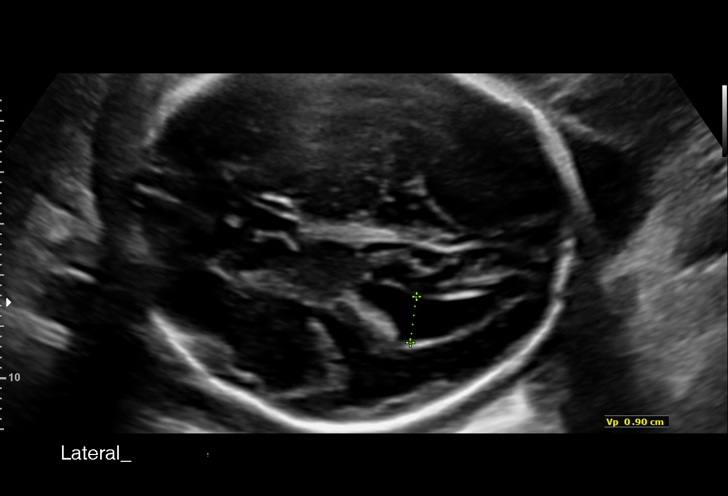
[im 17/42]
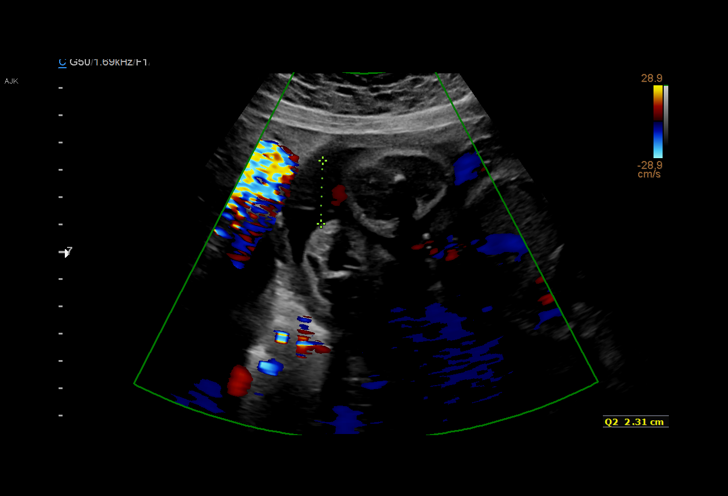
[im 20/42]
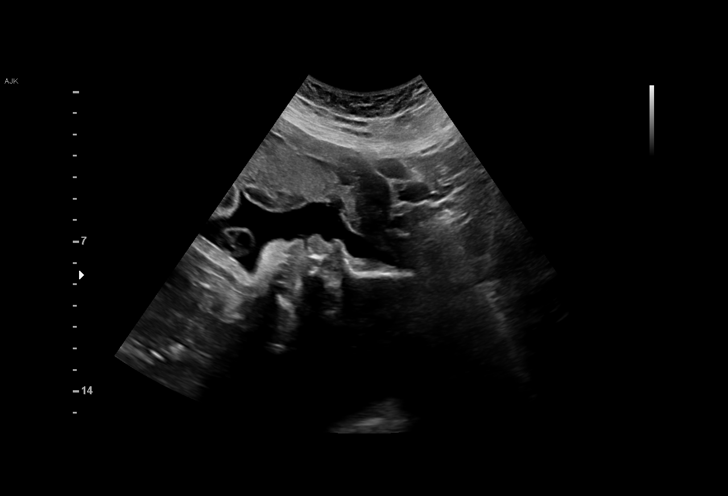
[im 23/42]
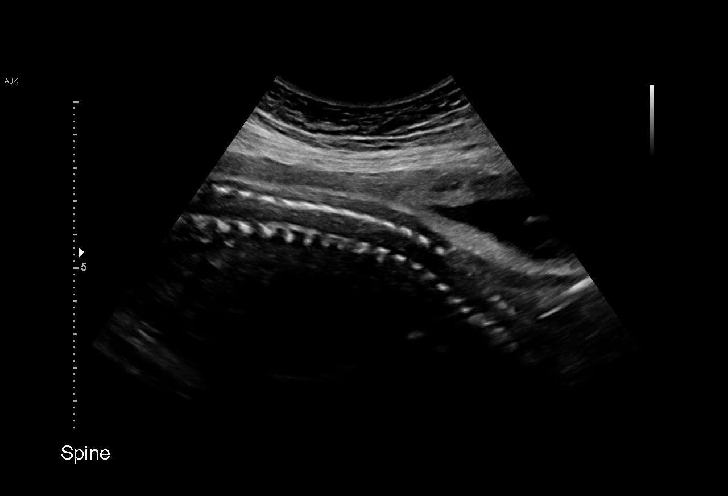
[im 26/42]
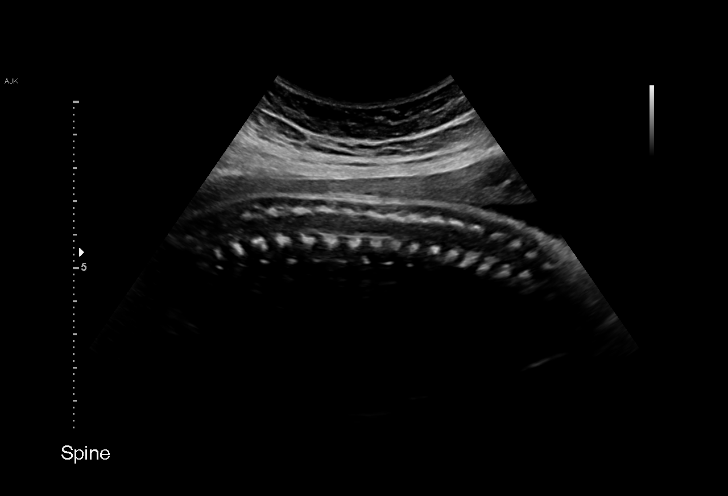
[im 29/42]
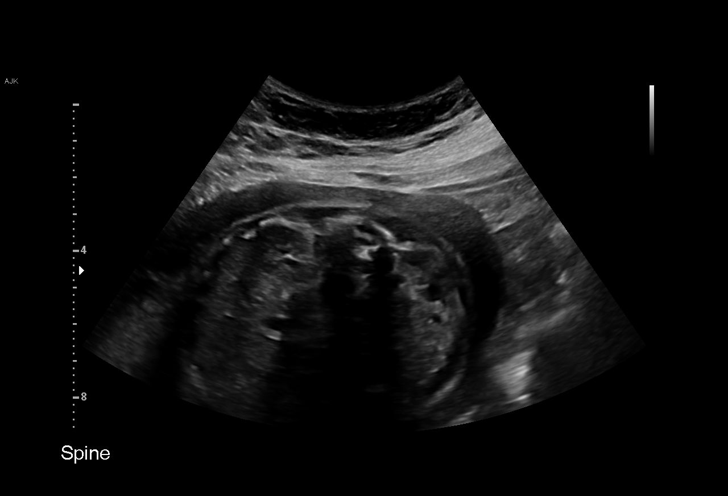
[im 32/42]
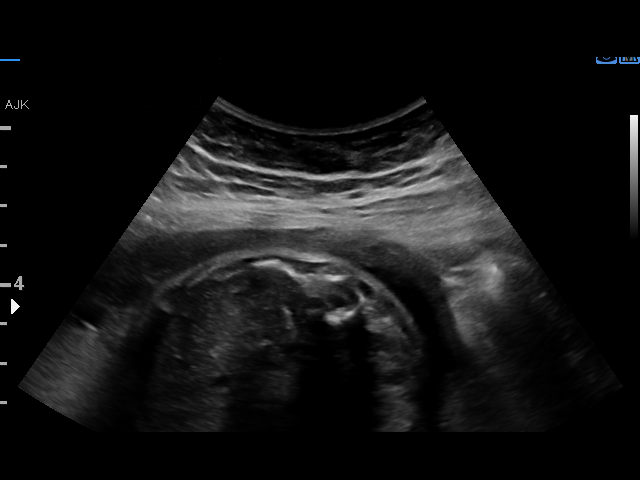
[im 35/42]
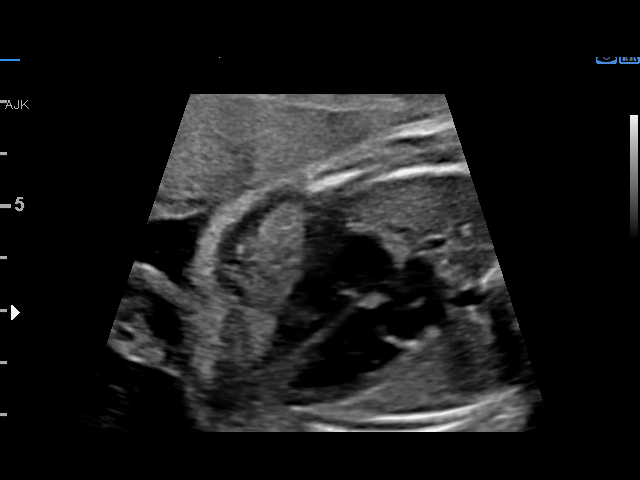
[im 38/42]
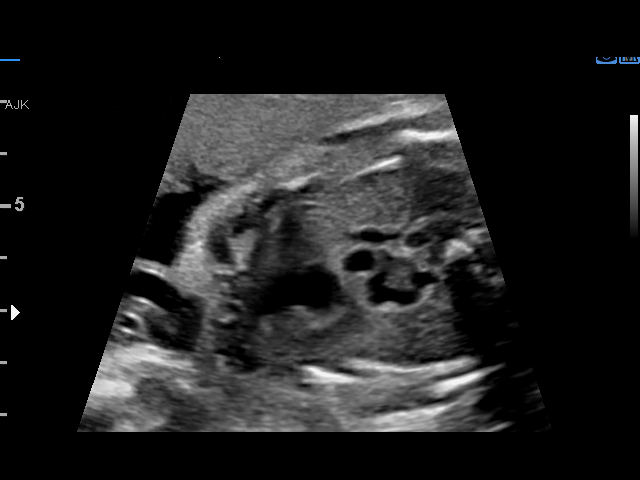
[im 42/42]
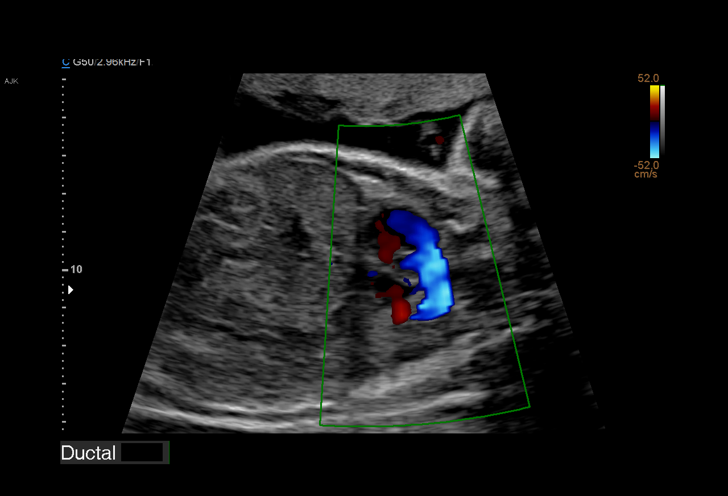

[14 of 28 positions shown; findings below may reference images not displayed]

[REDACTED]

Indications

28 weeks gestation of pregnancy
Obesity complicating pregnancy, third
trimester
Encounter for fetal anatomic survey
OB History

Blood Type:            Height:  5'5"   Weight (lb):  218       BMI:
Gravidity:    1         Term:   0        Prem:   0        SAB:   0
TOP:          0       Ectopic:  0        Living: 0
Fetal Evaluation

Num Of Fetuses:     1
Fetal Heart         164
Rate(bpm):
Cardiac Activity:   Observed
Presentation:       Cephalic
Placenta:           Anterior, above cervical os
P. Cord Insertion:  Visualized, central

Amniotic Fluid
AFI FV:      Subjectively within normal limits

AFI Sum(cm)     %Tile       Largest Pocket(cm)
11.34           22

RUQ(cm)       RLQ(cm)       LUQ(cm)        LLQ(cm)
3.2
Biometry
BPD:      73.2  mm     G. Age:  29w 3d         73  %    CI:        74.16   %    70 - 86
FL/HC:      18.8   %    18.8 -
HC:      269.9  mm     G. Age:  29w 3d         54  %    HC/AC:      1.06        1.05 -
AC:      254.5  mm     G. Age:  29w 5d         80  %    FL/BPD:     69.4   %    71 - 87
FL:       50.8  mm     G. Age:  27w 2d         11  %    FL/AC:      20.0   %    20 - 24

Est. FW:    3422  gm    2 lb 13 oz      61  %
Gestational Age

LMP:           28w 2d        Date:  05/28/17                 EDD:   03/04/18
U/S Today:     29w 0d                                        EDD:   02/27/18
Best:          28w 2d     Det. By:  LMP  (05/28/17)          EDD:   03/04/18
Anatomy

Cranium:               Appears normal         Aortic Arch:            Appears normal
Cavum:                 Appears normal         Ductal Arch:            Appears normal
Ventricles:            Appears normal         Diaphragm:              Appears normal
Choroid Plexus:        Previously seen        Stomach:                Appears normal, left
sided
Cerebellum:            Previously seen        Abdomen:                Appears normal
Posterior Fossa:       Previously seen        Abdominal Wall:         Previously seen
Nuchal Fold:           Not applicable (>20    Cord Vessels:           Previously seen
wks GA)
Face:                  Orbits previously      Kidneys:                Previously seen
seen
Lips:                  Appears normal         Bladder:                Appears normal
Thoracic:              Appears normal         Spine:                  Appears normal
Heart:                 Appears normal         Upper Extremities:      Previously seen
(4CH, axis, and situs
RVOT:                  Appears normal         Lower Extremities:      Previously seen
LVOT:                  Appears normal

Other:  Fetus appears to be a female. Heels previously seen.
Cervix Uterus Adnexa

Cervix
Not adaquately visualized

Uterus
No abnormality visualized.

Left Ovary
Not visualized.

Right Ovary
Not visualized.

Adnexa:       No abnormality visualized.
Impression

Singleton intrauterine pregnancy at 28+2 weeks, here to
complete anatomic survey
Interval review of the anatomy shows no sonographic
markers for aneuploidy or structural anomalies
All relevant fetal anatomy has been visualized
Amniotic fluid volume is normal
Estimated fetal weight is 1288g which is growth in the 61st
percentile
Recommendations

Follow-up ultrasounds as clinically indicated.

## 2018-03-12 ENCOUNTER — Telehealth: Payer: Self-pay

## 2018-03-12 NOTE — Telephone Encounter (Signed)
Left message for patient to call back and schedule a postpartum visit. Armandina StammerJennifer Trenesha Alcaide RNBSN

## 2018-03-15 ENCOUNTER — Inpatient Hospital Stay (HOSPITAL_COMMUNITY)
Admission: AD | Admit: 2018-03-15 | Payer: Medicaid Other | Source: Ambulatory Visit | Admitting: Obstetrics and Gynecology

## 2018-04-03 ENCOUNTER — Encounter: Payer: Self-pay | Admitting: Family Medicine

## 2018-04-03 DIAGNOSIS — K8020 Calculus of gallbladder without cholecystitis without obstruction: Secondary | ICD-10-CM

## 2018-04-19 ENCOUNTER — Encounter: Payer: Self-pay | Admitting: Family Medicine

## 2018-04-19 ENCOUNTER — Encounter: Payer: Medicaid Other | Admitting: Family Medicine

## 2018-04-19 ENCOUNTER — Ambulatory Visit (INDEPENDENT_AMBULATORY_CARE_PROVIDER_SITE_OTHER): Payer: 59 | Admitting: Family Medicine

## 2018-04-19 DIAGNOSIS — Z1389 Encounter for screening for other disorder: Secondary | ICD-10-CM

## 2018-04-19 DIAGNOSIS — K8020 Calculus of gallbladder without cholecystitis without obstruction: Secondary | ICD-10-CM | POA: Diagnosis not present

## 2018-04-19 NOTE — Progress Notes (Signed)
Form for pt to return to work was filled out today and returned to pt.

## 2018-04-19 NOTE — Progress Notes (Signed)
Post Partum Exam  Kristina Brown is a 22 y.o. G82P1001 female who presents for a postpartum visit. She is 6 weeks postpartum following a spontaneous vaginal delivery. I have fully reviewed the prenatal and intrapartum course. The delivery was at 40 gestational weeks.  Anesthesia: epidural. Postpartum course has been complicated by gall bladder pain. Baby's course has been normal. Baby is feeding by bottle Rush Barer Good start . Bleeding no bleeding. Bowel function is normal. Bladder function is normal. Patient is not sexually active. Contraception method is none. Postpartum depression screening:neg  The following portions of the patient's history were reviewed and updated as appropriate: allergies, current medications, past family history, past medical history, past social history, past surgical history and problem list.  Review of Systems Pertinent items are noted in HPI.    Objective:  Last menstrual period 05/28/2017, unknown if currently breastfeeding.  General:  alert, cooperative and no distress  Lungs: clear to auscultation bilaterally  Heart:  regular rate and rhythm, S1, S2 normal, no murmur, click, rub or gallop  Abdomen: soft, non-tender; bowel sounds normal; no masses,  no organomegaly        Assessment:    normal postpartum exam. Pap smear not done at today's visit per patient request  Plan:   1. Contraception: condoms - wants to avoid hormonal contraception until gall bladder fixed. 2. F/u with general surgery 3. Follow up in: 2 months or as needed.

## 2018-04-29 ENCOUNTER — Encounter (HOSPITAL_COMMUNITY): Payer: Self-pay | Admitting: Emergency Medicine

## 2018-04-29 ENCOUNTER — Emergency Department (HOSPITAL_COMMUNITY): Payer: 59

## 2018-04-29 ENCOUNTER — Emergency Department (HOSPITAL_COMMUNITY)
Admission: EM | Admit: 2018-04-29 | Discharge: 2018-04-29 | Disposition: A | Discharge status: Home / Self Care | Payer: 59 | Attending: Emergency Medicine | Admitting: Emergency Medicine

## 2018-04-29 DIAGNOSIS — R1011 Right upper quadrant pain: Secondary | ICD-10-CM | POA: Diagnosis not present

## 2018-04-29 DIAGNOSIS — K8020 Calculus of gallbladder without cholecystitis without obstruction: Secondary | ICD-10-CM | POA: Diagnosis not present

## 2018-04-29 LAB — CBC WITH DIFFERENTIAL/PLATELET
Abs Immature Granulocytes: 0 10*3/uL (ref 0.0–0.1)
Basophils Absolute: 0.1 10*3/uL (ref 0.0–0.1)
Basophils Relative: 1 %
EOS ABS: 0.2 10*3/uL (ref 0.0–0.7)
EOS PCT: 2 %
HEMATOCRIT: 33.9 % — AB (ref 36.0–46.0)
Hemoglobin: 11.1 g/dL — ABNORMAL LOW (ref 12.0–15.0)
Immature Granulocytes: 0 %
LYMPHS ABS: 1.7 10*3/uL (ref 0.7–4.0)
Lymphocytes Relative: 18 %
MCH: 28.8 pg (ref 26.0–34.0)
MCHC: 32.7 g/dL (ref 30.0–36.0)
MCV: 88.1 fL (ref 78.0–100.0)
MONOS PCT: 6 %
Monocytes Absolute: 0.6 10*3/uL (ref 0.1–1.0)
Neutro Abs: 6.9 10*3/uL (ref 1.7–7.7)
Neutrophils Relative %: 73 %
Platelets: 299 10*3/uL (ref 150–400)
RBC: 3.85 MIL/uL — ABNORMAL LOW (ref 3.87–5.11)
RDW: 13.1 % (ref 11.5–15.5)
WBC: 9.5 10*3/uL (ref 4.0–10.5)

## 2018-04-29 LAB — COMPREHENSIVE METABOLIC PANEL
ALT: 56 U/L — ABNORMAL HIGH (ref 14–54)
ANION GAP: 8 (ref 5–15)
AST: 72 U/L — ABNORMAL HIGH (ref 15–41)
Albumin: 3.4 g/dL — ABNORMAL LOW (ref 3.5–5.0)
Alkaline Phosphatase: 122 U/L (ref 38–126)
BUN: 8 mg/dL (ref 6–20)
CHLORIDE: 108 mmol/L (ref 101–111)
CO2: 22 mmol/L (ref 22–32)
Calcium: 8.7 mg/dL — ABNORMAL LOW (ref 8.9–10.3)
Creatinine, Ser: 0.77 mg/dL (ref 0.44–1.00)
GFR calc non Af Amer: >60 mL/min (ref >60)
GLUCOSE: 105 mg/dL — AB (ref 65–99)
Potassium: 4 mmol/L (ref 3.5–5.1)
Sodium: 138 mmol/L (ref 135–145)
Total Bilirubin: 0.7 mg/dL (ref 0.3–1.2)
Total Protein: 6.5 g/dL (ref 6.5–8.1)

## 2018-04-29 LAB — LIPASE, BLOOD: Lipase: 33 U/L (ref 11–51)

## 2018-04-29 LAB — I-STAT BETA HCG BLOOD, ED (MC, WL, AP ONLY)

## 2018-04-29 MED ORDER — HYDROCODONE-ACETAMINOPHEN 5-325 MG PO TABS: 1.0000 | ORAL_TABLET | Freq: Four times a day (QID) | ORAL | 0 refills | Status: DC | PRN

## 2018-04-29 MED ORDER — ONDANSETRON HCL 4 MG PO TABS: 4.0000 mg | ORAL_TABLET | Freq: Three times a day (TID) | ORAL | 0 refills | Status: DC | PRN

## 2018-04-29 MED ORDER — MORPHINE SULFATE (PF) 4 MG/ML IV SOLN: 4.0000 mg | Freq: Once | INTRAVENOUS | Status: DC

## 2018-04-29 NOTE — ED Provider Notes (Signed)
[facility] EMERGENCY DEPARTMENT Provider Note   CSN: [id number] Arrival date & time: 04/29/18  0345     History   Chief Complaint Chief Complaint  Patient presents with  . Abdominal Pain    HPI Kristina Brown is a 22 y.o. female.  HPI   22 year old female presenting for evaluation of abdominal pain.  Patient report for the past 3 days she has had pain to her right upper quadrant.  Pain is described as a dull aching sensation, sometimes radiates to her back, usually manifests around morning time typically around 2 AM to 5AM and waking her up at night.  She endorses associated nausea without vomiting or diarrhea.  Last episode was last night.  States pain was intense.  Endorsed subjective fever.  She denies chest pain, shortness of breath, productive cough, hemoptysis, dysuria, vaginal bleeding or vaginal discharge.  Patient is 2 months postpartum.  She did develop similar presentation during her pregnancy.  Abdominal ultrasound performed on 02/19/2018 demonstrate gallstones without evidence of cholecystitis.  Due to her pregnancy, no surgical intervention at that time.  Patient is currently not breast-feeding.  She has been very careful with her diet but states sometimes dairy food does aggravate her pain.  Currently she endorsed minimal abdominal pain.  She was also made aware that her liver enzyme was elevated during her pregnancy.  She denies any history of alcohol abuse or active diabetes.  She takes Motrin on occasion for pain.  She is a G1, P1, with normal vaginal delivery and currently having healthy baby.  Past Medical History:  Diagnosis Date  . Medical history non-contributory   . Pregnancy induced hypertension     Patient Active Problem List   Diagnosis Date Noted  . History of depression 10/23/2017    Past Surgical History:  Procedure Laterality Date  . NO PAST SURGERIES       OB History    Gravida  1   Para  1   Term  1   Preterm      AB      Living  1     SAB      TAB      Ectopic      Multiple  0   Live Births  1            Home Medications    Prior to Admission medications   Not on File    Family History Family History  Problem Relation Age of Onset  . Diabetes Mother   . Stroke Maternal Grandmother   . Cancer Paternal Grandmother        breast  . Birth defects Neg Hx   . Hypertension Neg Hx     Social History Social History   Tobacco Use  . Smoking status: Never Smoker  . Smokeless tobacco: Never Used  Substance Use Topics  . Alcohol use: No    Frequency: Never  . Drug use: No     Allergies   Latex   Review of Systems Review of Systems  All other systems reviewed and are negative.    Physical Exam Updated Vital Signs BP 117/66 (BP Location: Right Arm)   Pulse 77   Temp 98.8 F (37.1 C) (Oral)   Resp 20   Ht  (1.651 m)   Wt 81.2 kg (179 lb)   LMP 05/28/2017 (Exact Date)   SpO2 100%   BMI 29.79 kg/m   Physical Exam  Constitutional: She  appears well-developed and well-nourished. No distress.  HENT:  Head: Atraumatic.  Eyes: Conjunctivae are normal.  Neck: Neck supple.  Pulmonary/Chest: Effort normal and breath sounds normal.  Abdominal: Normal appearance and bowel sounds are normal. There is tenderness in the epigastric area. There is no rigidity, no rebound, no guarding, no tenderness at McBurney's point and negative Murphy's sign.  Musculoskeletal: Normal range of motion.  Neurological: She is alert.  Skin: No rash noted.  Psychiatric: She has a normal mood and affect.  Nursing note and vitals reviewed.    ED Treatments / Results  Labs (all labs ordered are listed, but only abnormal results are displayed) Labs Reviewed  CBC WITH DIFFERENTIAL/PLATELET - Abnormal; Notable for the following components:      Result Value   RBC 3.85 (*)    Hemoglobin 11.1 (*)    HCT 33.9 (*)    All other components within normal limits  COMPREHENSIVE METABOLIC  PANEL - Abnormal; Notable for the following components:   Glucose, Bld 105 (*)    Calcium 8.7 (*)    Albumin 3.4 (*)    AST 72 (*)    ALT 56 (*)    All other components within normal limits  LIPASE, BLOOD  I-STAT BETA HCG BLOOD, ED ([facility], [facility], [facility] ONLY)    EKG None  Radiology US Abdomen Limited Ruq  Result Date: 04/29/2018 CLINICAL DATA:  Right upper quadrant pain for 2 weeks.  Gallstones. EXAM: ULTRASOUND ABDOMEN LIMITED RIGHT UPPER QUADRANT COMPARISON:  02/21/2018 FINDINGS: Gallbladder: Cholelithiasis with small stones in the dependent gallbladder. No gallbladder wall thickening or edema. Murphy's sign is negative. Common bile duct: Diameter: 2.6 mm, normal Liver: No focal lesion identified. Within normal limits in parenchymal echogenicity. Portal vein is patent on color Doppler imaging with normal direction of blood flow towards the liver. IMPRESSION: Cholelithiasis. No additional changes to suggest acute cholecystitis. Electronically Signed   By: Burman Nieves M.D.   On: 04/29/2018 05:37    Procedures Procedures (including critical care time)  Medications Ordered in ED Medications  morphine 4 MG/ML injection 4 mg (has no administration in time range)     Initial Impression / Assessment and Plan / ED Course  I have reviewed the triage vital signs and the nursing notes.  Pertinent labs & imaging results that were available during my care of the patient were reviewed by me and considered in my medical decision making (see chart for details).     BP 124/71 (BP Location: Right Arm) Comment: Simultaneous filing. User may not have seen previous data.  Pulse 90 Comment: Simultaneous filing. User may not have seen previous data.  Temp 98.2 F (36.8 C) (Oral)   Resp 16   Ht  (1.651 m)   Wt 81.2 kg (179 lb)   LMP 05/28/2017 (Exact Date)   SpO2 100% Comment: Simultaneous filing. User may not have seen previous data.  BMI 29.79 kg/m    Final Clinical Impressions(s) / ED  Diagnoses   Final diagnoses:  RUQ pain  Calculus of gallbladder without cholecystitis without obstruction    ED Discharge Orders        Ordered    HYDROcodone-acetaminophen (NORCO/VICODIN) 5-325 MG tablet  Every 6 hours PRN     04/29/18 0659    ondansetron (ZOFRAN) 4 MG tablet  Every 8 hours PRN     04/29/18 0659     6:29 AM Patient here with right upper quadrant abdominal pain.  Recently diagnosed with cholelithiasis during her  pregnancy.  She is 2 months postpartum.  Her pain is minimal at this time.  Limited abdominal ultrasound performed demonstrate evidence of cholelithiasis but no additional change to suggest acute cholecystitis.  Labs reveal mild transaminitis with AST 72, ALT 56 but normal lipase and normal WBC.  Her pain is likely related to her gallstones.  No indication to suggest emergent abdominal surgery however, patient would like further management as the pain has returned with more frequency.  Will consult general surgery for further recommendation.  6:40 AM Appreciate consultation from on call surgery Dr. Andrey Campanile who recommend pt to call office for appointment.  He also recommend providing pt with a short course of pain medication and return precaution. In order to decrease risk of narcotic abuse. Pt's record were checked using the [place] Controlled Substance database.     Fayrene Helper, PA-C 04/29/18 0703    Dione Booze, MD 04/29/18 [phone]

## 2018-04-29 NOTE — Discharge Instructions (Signed)
Please call tomorrow to schedule close follow up appointment.

## 2018-04-29 NOTE — ED Triage Notes (Signed)
Pt c/o RUQ pain intermittent x 2 weeks, severe for last 2 hours. Pt is 2 mo post partum, last u/s 3/19. Pain tonight more significant that in the past.

## 2018-05-10 MED FILL — HYDROCODON-APAP 5-325: 5-325 | 3 days supply | Qty: 10 | Fill #0

## 2018-05-20 IMAGING — US US ABDOMEN LIMITED
1 series · 15 of 25 positions shown · non-contrast
Comparison: None.

CLINICAL DATA: 38 weeks pregnant, elevated LFTs, severe upper
abdominal pain

EXAM:
ULTRASOUND ABDOMEN LIMITED RIGHT UPPER QUADRANT

[Series 1: us abdomen limited · 15 of 56 slices shown]
[im 1/56]
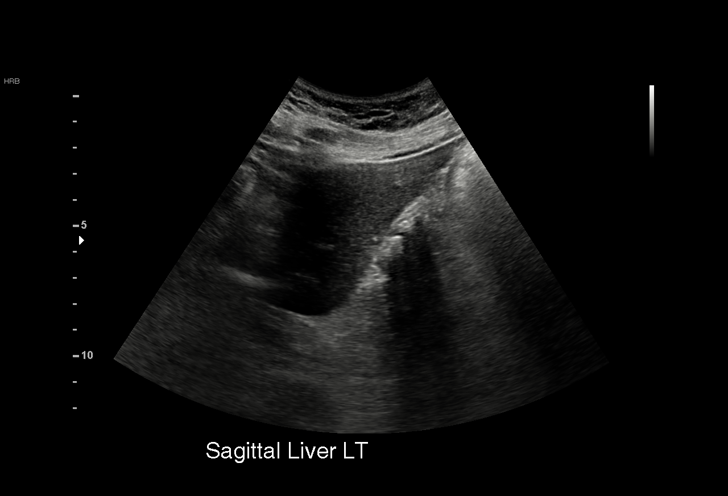
[im 5/56]
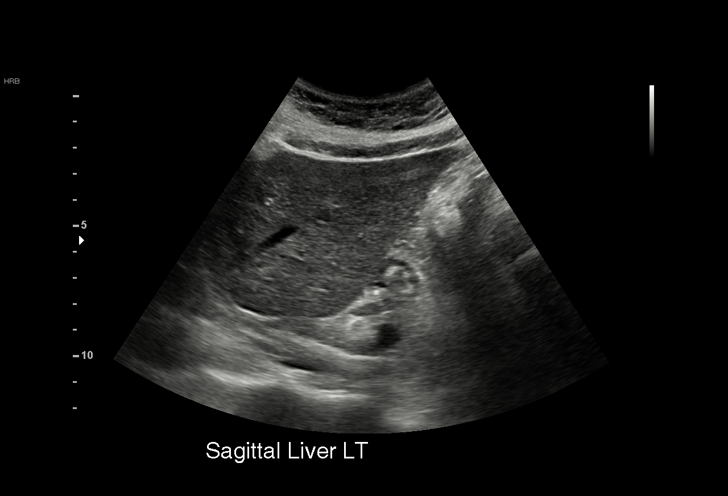
[im 10/56]
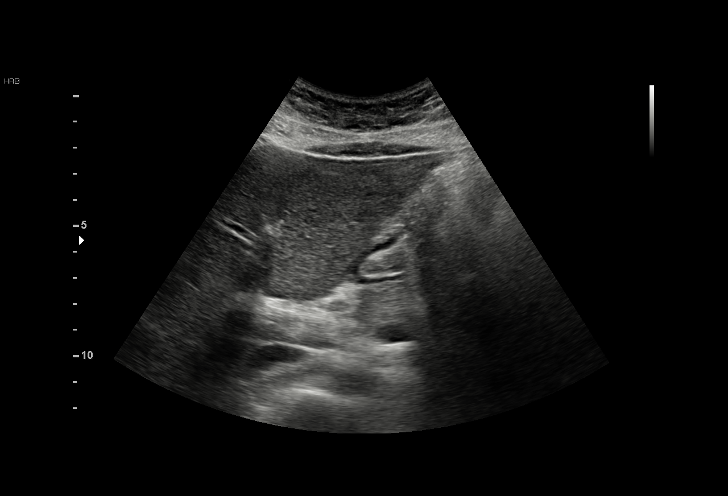
[im 12/56]
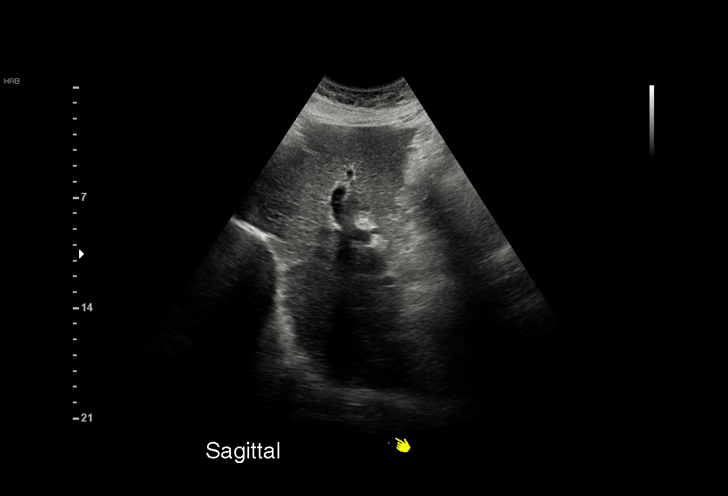
[im 17/56]
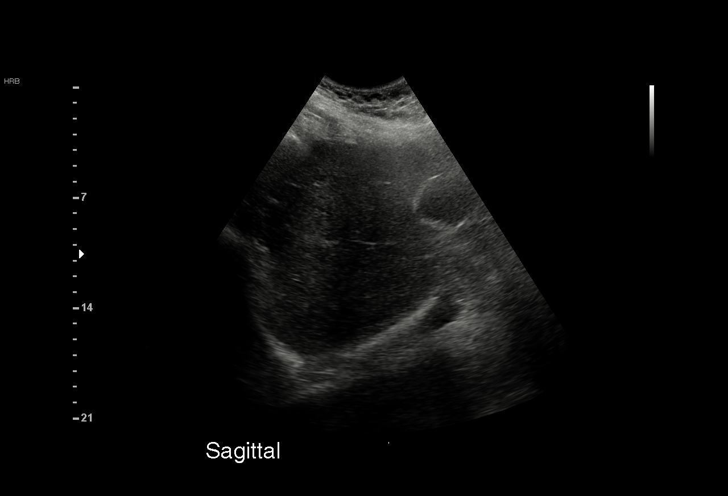
[im 21/56]
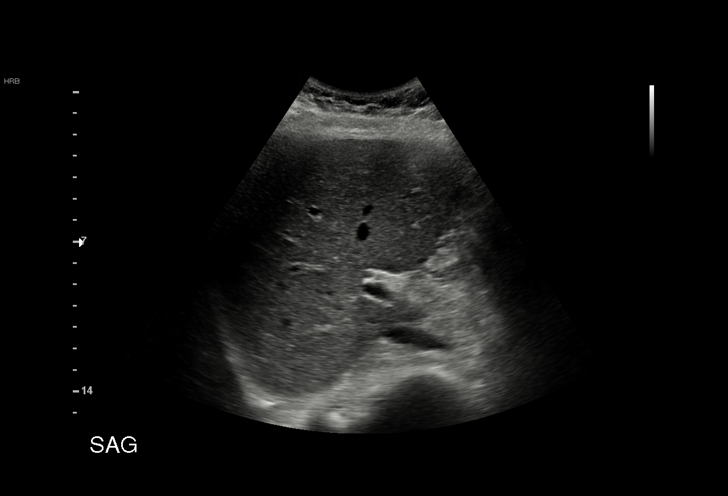
[im 23/56]
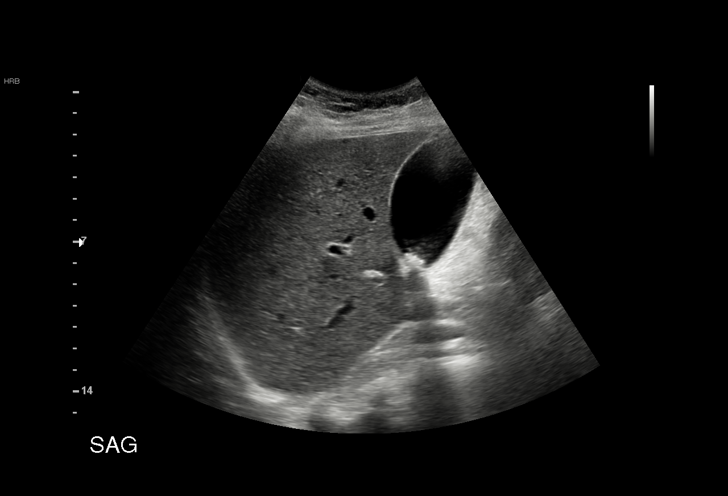
[im 28/56]
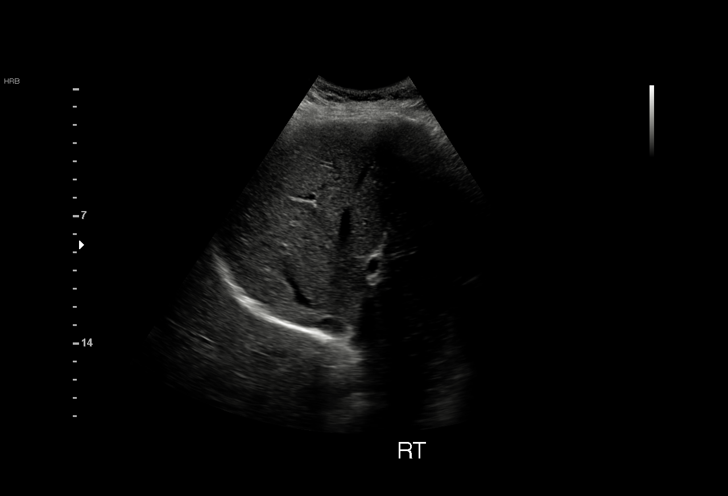
[im 33/56]
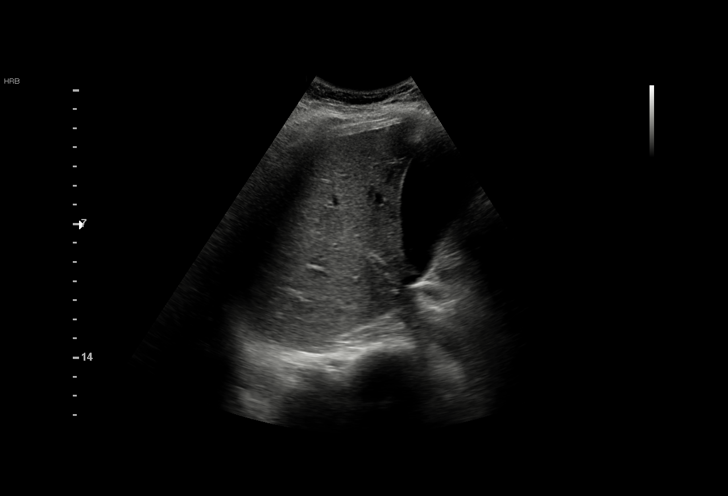
[im 35/56]
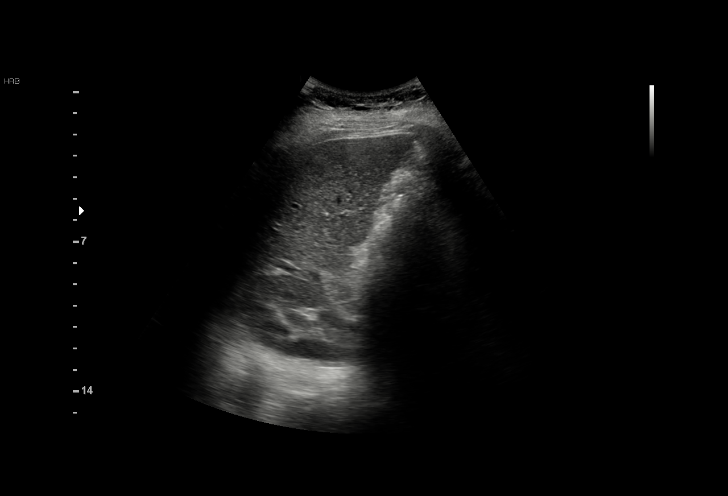
[im 39/56]
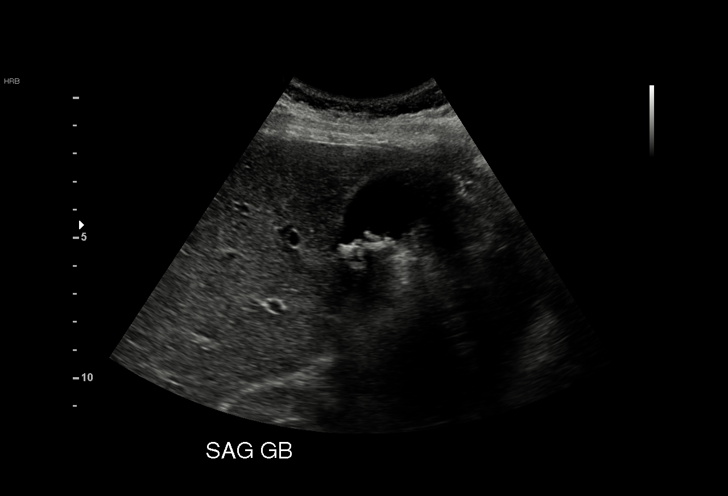
[im 44/56]
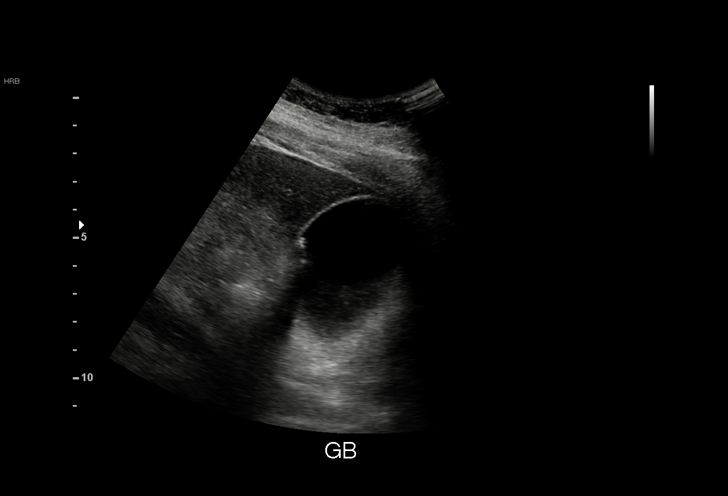
[im 46/56]
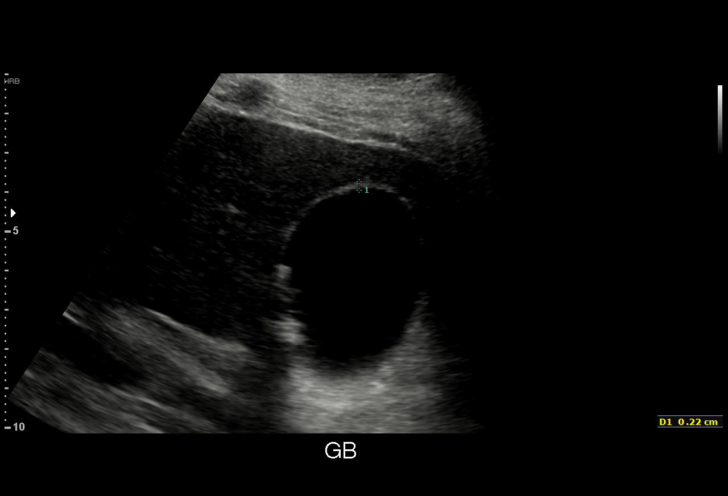
[im 51/56]
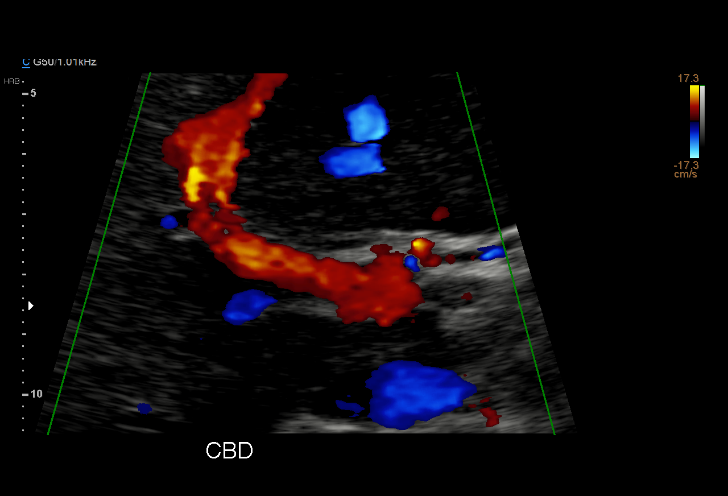
[im 56/56]
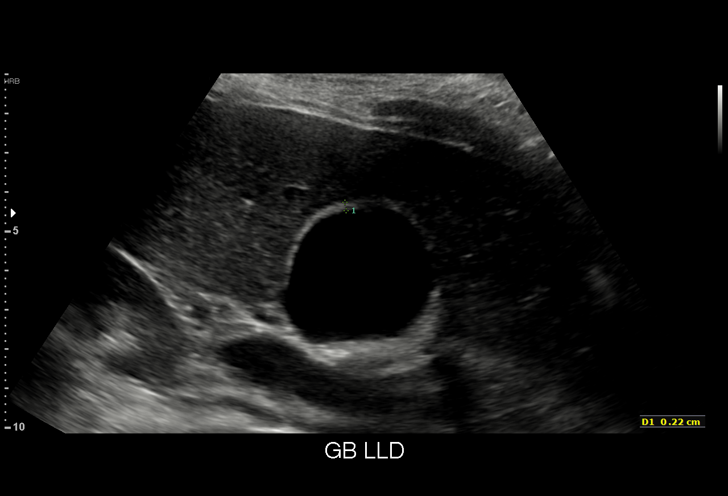

[15 of 25 positions shown; findings below may reference images not displayed]

FINDINGS: Gallbladder:

Layering gallstones. No gallbladder wall thickening. Possible trace
pericholecystic fluid.

Common bile duct:

Diameter: 2 mm

Liver:

No focal lesion identified. Within normal limits in parenchymal
echogenicity. Portal vein is patent on color Doppler imaging with
normal direction of blood flow towards the liver.
IMPRESSION: Cholelithiasis, without associated sonographic findings to suggest
acute cholecystitis.

## 2018-05-27 ENCOUNTER — Emergency Department (HOSPITAL_COMMUNITY)
Admission: EM | Admit: 2018-05-27 | Discharge: 2018-05-27 | Disposition: A | Discharge status: Home / Self Care | Payer: 59 | Attending: Emergency Medicine | Admitting: Emergency Medicine

## 2018-05-27 ENCOUNTER — Encounter (HOSPITAL_COMMUNITY): Payer: Self-pay | Admitting: *Deleted

## 2018-05-27 ENCOUNTER — Other Ambulatory Visit: Payer: Self-pay

## 2018-05-27 DIAGNOSIS — R1011 Right upper quadrant pain: Secondary | ICD-10-CM | POA: Diagnosis not present

## 2018-05-27 DIAGNOSIS — R109 Unspecified abdominal pain: Secondary | ICD-10-CM | POA: Diagnosis not present

## 2018-05-27 DIAGNOSIS — Z5321 Procedure and treatment not carried out due to patient leaving prior to being seen by health care provider: Secondary | ICD-10-CM | POA: Insufficient documentation

## 2018-05-27 HISTORY — DX: Calculus of gallbladder without cholecystitis without obstruction: K80.20

## 2018-05-27 LAB — I-STAT BETA HCG BLOOD, ED (MC, WL, AP ONLY): I-stat hCG, quantitative: <5 m[IU]/mL (ref <5)

## 2018-05-27 LAB — CBC
HEMATOCRIT: 38.5 % (ref 36.0–46.0)
Hemoglobin: 12.4 g/dL (ref 12.0–15.0)
MCH: 28.4 pg (ref 26.0–34.0)
MCHC: 32.2 g/dL (ref 30.0–36.0)
MCV: 88.1 fL (ref 78.0–100.0)
Platelets: 367 10*3/uL (ref 150–400)
RBC: 4.37 MIL/uL (ref 3.87–5.11)
RDW: 12.4 % (ref 11.5–15.5)
WBC: 9.5 10*3/uL (ref 4.0–10.5)

## 2018-05-27 LAB — COMPREHENSIVE METABOLIC PANEL
ALT: 31 U/L (ref 14–54)
AST: 46 U/L — AB (ref 15–41)
Albumin: 3.5 g/dL (ref 3.5–5.0)
Alkaline Phosphatase: 86 U/L (ref 38–126)
Anion gap: 11 (ref 5–15)
BUN: 5 mg/dL — ABNORMAL LOW (ref 6–20)
CHLORIDE: 102 mmol/L (ref 101–111)
CO2: 24 mmol/L (ref 22–32)
Calcium: 9.1 mg/dL (ref 8.9–10.3)
Creatinine, Ser: 0.8 mg/dL (ref 0.44–1.00)
Glucose, Bld: 129 mg/dL — ABNORMAL HIGH (ref 65–99)
POTASSIUM: 3.9 mmol/L (ref 3.5–5.1)
Sodium: 137 mmol/L (ref 135–145)
Total Bilirubin: 1.4 mg/dL — ABNORMAL HIGH (ref 0.3–1.2)
Total Protein: 7.3 g/dL (ref 6.5–8.1)

## 2018-05-27 LAB — LIPASE, BLOOD: Lipase: 31 U/L (ref 11–51)

## 2018-05-27 MED ORDER — ONDANSETRON 4 MG PO TBDP
4.0000 mg | ORAL_TABLET | Freq: Once | ORAL | Status: AC
  Administered 2018-05-27: 4 mg via ORAL
  Filled 2018-05-27: qty 1

## 2018-05-27 NOTE — ED Notes (Signed)
Patient stated she couldn't wait any longer because she needed to get back home to her child.

## 2018-05-27 NOTE — ED Triage Notes (Signed)
Pt has known gallstones and now with RUQ pain and reports vomiting and fever,  Last took Motrin at 1100.

## 2018-05-27 NOTE — ED Provider Notes (Cosign Needed)
Patient placed in Quick Look pathway, seen and evaluated   Chief Complaint: abdominal pain  HPI:   Kristina Brown is a 22 y.o. female who present to the ED with abdominal pain. Patient reports she found out she had gall stones while she was pregnant in April. She has had pain off and on since then and has an appointment this week with a surgeon. Last nigh the pain got so bad that patient decided she needed to come in today. Patient vomited x 4 last night and x2 this morning.   ROS: GI: abdominal pain, n/v Physical Exam:  BP 124/67 (BP Location: Left Arm)   Pulse (!) 102   Temp 98.6 F (37 C) (Oral)   Resp 18   LMP 05/20/2018   SpO2 100%   Breastfeeding? No     Gen: No distress  Neuro: Awake and Alert  Skin: Warm and dry  Abdomen: soft, tender with palpation epigastric and RUQ.     Initiation of care has begun. The patient has been counseled on the process, plan, and necessity for staying for the completion/evaluation, and the remainder of the medical screening examination    Janne Napoleoneese, Hope M, NP 05/27/18 1459

## 2018-05-30 DIAGNOSIS — K8010 Calculus of gallbladder with chronic cholecystitis without obstruction: Secondary | ICD-10-CM | POA: Diagnosis not present

## 2018-06-03 ENCOUNTER — Encounter (HOSPITAL_COMMUNITY)
Admission: RE | Admit: 2018-06-03 | Discharge: 2018-06-03 | Disposition: A | Discharge status: Home / Self Care | Payer: 59 | Source: Ambulatory Visit | Attending: General Surgery | Admitting: General Surgery

## 2018-06-03 ENCOUNTER — Other Ambulatory Visit: Payer: Self-pay

## 2018-06-03 ENCOUNTER — Encounter (HOSPITAL_COMMUNITY): Payer: Self-pay

## 2018-06-03 ENCOUNTER — Ambulatory Visit: Payer: Self-pay | Admitting: General Surgery

## 2018-06-03 DIAGNOSIS — Z803 Family history of malignant neoplasm of breast: Secondary | ICD-10-CM | POA: Diagnosis not present

## 2018-06-03 DIAGNOSIS — Z91040 Latex allergy status: Secondary | ICD-10-CM | POA: Diagnosis not present

## 2018-06-03 DIAGNOSIS — Z8249 Family history of ischemic heart disease and other diseases of the circulatory system: Secondary | ICD-10-CM | POA: Diagnosis not present

## 2018-06-03 DIAGNOSIS — K8080 Other cholelithiasis without obstruction: Secondary | ICD-10-CM | POA: Diagnosis present

## 2018-06-03 DIAGNOSIS — Z793 Long term (current) use of hormonal contraceptives: Secondary | ICD-10-CM | POA: Diagnosis not present

## 2018-06-03 DIAGNOSIS — K8010 Calculus of gallbladder with chronic cholecystitis without obstruction: Secondary | ICD-10-CM | POA: Diagnosis not present

## 2018-06-03 NOTE — Patient Instructions (Signed)
Solon AugustaLauren R Soo  06/03/2018   Your procedure is scheduled on: 06-05-18   Report [facility]    Report to admitting at 8:30AM    Call this number if you have problems the morning of surgery [phone]     Remember: Do not eat food or drink liquids :After Midnight.     Take these medicines the morning of surgery with A SIP OF WATER: none                                 You may not have any metal on your body including hair pins and              piercings  Do not wear jewelry, make-up, lotions, powders or perfumes, deodorant             Do not wear nail polish.  Do not shave  48 hours prior to surgery.      Do not bring valuables to the hospital. [place] IS NOT             RESPONSIBLE   FOR VALUABLES.  Contacts, dentures or bridgework may not be worn into surgery.      Patients discharged the day of surgery will not be allowed to drive home.  Name and phone number of your driver:  Special Instructions: N/A              Please read over the following fact sheets you were given: _____________________________________________________________________             [facility] Pre[facility] Before surgery, you can play an important role.  Because skin is not sterile, your skin needs to be as free of germs as possible.  You can reduce the number of germs on your skin by washing with CHG (chlorahexidine gluconate) soap before surgery.  CHG is an antiseptic cleaner which kills germs and bonds with the skin to continue killing germs even after washing. Please DO NOT use if you have an allergy to CHG or antibacterial soaps.  If your skin becomes reddened/irritated stop using the CHG and inform your nurse when you arrive at Short Stay. Do not shave (including legs and underarms) for at least 48 hours prior to the first CHG shower.  You may shave your face/neck. Please follow these instructions carefully:  1.  Shower with CHG Soap the  night before surgery and the  morning of Surgery.  2.  If you choose to wash your hair, wash your hair first as usual with your  normal  shampoo.  3.  After you shampoo, rinse your hair and body thoroughly to remove the  shampoo.                           4.  Use CHG as you would any other liquid soap.  You can apply chg directly  to the skin and wash                       Gently with a scrungie or clean washcloth.  5.  Apply the CHG Soap to your body ONLY FROM THE NECK DOWN.   Do not use on face/ open  Wound or open sores. Avoid contact with eyes, ears mouth and genitals (private parts).                       Wash face,  Genitals (private parts) with your normal soap.             6.  Wash thoroughly, paying special attention to the area where your surgery  will be performed.  7.  Thoroughly rinse your body with warm water from the neck down.  8.  DO NOT shower/wash with your normal soap after using and rinsing off  the CHG Soap.                9.  Pat yourself dry with a clean towel.            10.  Wear clean pajamas.            11.  Place clean sheets on your bed the night of your first shower and do not  sleep with pets. Day of Surgery : Do not apply any lotions/deodorants the morning of surgery.  Please wear clean clothes to the hospital/surgery center.  FAILURE TO FOLLOW THESE INSTRUCTIONS MAY RESULT IN THE CANCELLATION OF YOUR SURGERY PATIENT SIGNATURE_________________________________  NURSE SIGNATURE__________________________________  ________________________________________________________________________

## 2018-06-04 NOTE — Anesthesia Preprocedure Evaluation (Addendum)
Anesthesia Evaluation  Patient identified by MRN, date of birth, ID band Patient awake    Reviewed: Allergy & Precautions, NPO status , Patient's Chart, lab work & pertinent test results  History of Anesthesia Complications (+) PONV  Airway Mallampati: II  TM Distance: >3 FB Neck ROM: Full    Dental no notable dental hx. (+) Teeth Intact, Dental Advisory Given   Pulmonary neg pulmonary ROS,    Pulmonary exam normal breath sounds clear to auscultation       Cardiovascular hypertension, negative cardio ROS Normal cardiovascular exam Rhythm:Regular Rate:Normal     Neuro/Psych negative neurological ROS  negative psych ROS   GI/Hepatic   Endo/Other    Renal/GU      Musculoskeletal   Abdominal   Peds  Hematology   Anesthesia Other Findings   Reproductive/Obstetrics negative OB ROS                             Anesthesia Physical Anesthesia Plan  ASA: III  Anesthesia Plan: General   Post-op Pain Management:    Induction: Intravenous  PONV Risk Score and Plan: 4 or greater and Treatment may vary due to age or medical condition, Ondansetron and Dexamethasone  Airway Management Planned: Oral ETT  Additional Equipment:   Intra-op Plan:   Post-operative Plan: Extubation in OR  Informed Consent: I have reviewed the patients History and Physical, chart, labs and discussed the procedure including the risks, benefits and alternatives for the proposed anesthesia with the patient or authorized representative who has indicated his/her understanding and acceptance.   Dental advisory given  Plan Discussed with: CRNA  Anesthesia Plan Comments:         Anesthesia Quick Evaluation

## 2018-06-05 ENCOUNTER — Ambulatory Visit (HOSPITAL_COMMUNITY): Payer: 59

## 2018-06-05 ENCOUNTER — Ambulatory Visit (HOSPITAL_COMMUNITY): Payer: 59 | Admitting: Certified Registered Nurse Anesthetist

## 2018-06-05 ENCOUNTER — Encounter (HOSPITAL_COMMUNITY): Payer: Self-pay

## 2018-06-05 ENCOUNTER — Ambulatory Visit (HOSPITAL_COMMUNITY)
Admission: RE | Admit: 2018-06-05 | Discharge: 2018-06-05 | Disposition: A | Discharge status: Home / Self Care | Payer: 59 | Source: Ambulatory Visit | Attending: General Surgery | Admitting: General Surgery

## 2018-06-05 ENCOUNTER — Encounter (HOSPITAL_COMMUNITY)
Admission: RE | Disposition: A | Discharge status: Home / Self Care | Payer: Self-pay | Source: Ambulatory Visit | Attending: General Surgery

## 2018-06-05 DIAGNOSIS — Z803 Family history of malignant neoplasm of breast: Secondary | ICD-10-CM | POA: Diagnosis not present

## 2018-06-05 DIAGNOSIS — Z8249 Family history of ischemic heart disease and other diseases of the circulatory system: Secondary | ICD-10-CM | POA: Diagnosis not present

## 2018-06-05 DIAGNOSIS — K8010 Calculus of gallbladder with chronic cholecystitis without obstruction: Secondary | ICD-10-CM | POA: Insufficient documentation

## 2018-06-05 DIAGNOSIS — Z91040 Latex allergy status: Secondary | ICD-10-CM | POA: Diagnosis not present

## 2018-06-05 DIAGNOSIS — Z793 Long term (current) use of hormonal contraceptives: Secondary | ICD-10-CM | POA: Diagnosis not present

## 2018-06-05 DIAGNOSIS — K8020 Calculus of gallbladder without cholecystitis without obstruction: Secondary | ICD-10-CM | POA: Diagnosis not present

## 2018-06-05 DIAGNOSIS — K824 Cholesterolosis of gallbladder: Secondary | ICD-10-CM | POA: Diagnosis not present

## 2018-06-05 DIAGNOSIS — I10 Essential (primary) hypertension: Secondary | ICD-10-CM | POA: Diagnosis not present

## 2018-06-05 DIAGNOSIS — Z419 Encounter for procedure for purposes other than remedying health state, unspecified: Secondary | ICD-10-CM

## 2018-06-05 HISTORY — PX: CHOLECYSTECTOMY: SHX55

## 2018-06-05 LAB — PREGNANCY, URINE: PREG TEST UR: NEGATIVE

## 2018-06-05 SURGERY — LAPAROSCOPIC CHOLECYSTECTOMY WITH INTRAOPERATIVE CHOLANGIOGRAM
Anesthesia: General

## 2018-06-05 MED ORDER — CHLORHEXIDINE GLUCONATE CLOTH 2 % EX PADS: 6.0000 | MEDICATED_PAD | Freq: Once | CUTANEOUS | Status: DC

## 2018-06-05 MED ORDER — BUPIVACAINE-EPINEPHRINE (PF) 0.25% -1:200000 IJ SOLN
INTRAMUSCULAR | Status: AC
  Filled 2018-06-05: qty 30

## 2018-06-05 MED ORDER — CEFAZOLIN SODIUM-DEXTROSE 2-4 GM/100ML-% IV SOLN
2.0000 g | INTRAVENOUS | Status: AC
  Administered 2018-06-05: 2 g via INTRAVENOUS
  Filled 2018-06-05: qty 100

## 2018-06-05 MED ORDER — IOPAMIDOL (ISOVUE-300) INJECTION 61%
INTRAVENOUS | Status: AC
  Filled 2018-06-05: qty 50

## 2018-06-05 MED ORDER — HYDROMORPHONE HCL 2 MG/ML IJ SOLN
INTRAMUSCULAR | Status: AC
  Filled 2018-06-05: qty 1

## 2018-06-05 MED ORDER — BUPIVACAINE-EPINEPHRINE 0.25% -1:200000 IJ SOLN
INTRAMUSCULAR | Status: DC | PRN
  Administered 2018-06-05: 27 mL

## 2018-06-05 MED ORDER — ROCURONIUM BROMIDE 50 MG/5ML IV SOSY
PREFILLED_SYRINGE | INTRAVENOUS | Status: DC | PRN
  Administered 2018-06-05: 40 mg via INTRAVENOUS

## 2018-06-05 MED ORDER — HYDROMORPHONE HCL 1 MG/ML IJ SOLN: 0.2500 mg | INTRAMUSCULAR | Status: DC | PRN

## 2018-06-05 MED ORDER — MIDAZOLAM HCL 2 MG/2ML IJ SOLN
INTRAMUSCULAR | Status: AC
  Filled 2018-06-05: qty 2

## 2018-06-05 MED ORDER — DEXAMETHASONE SODIUM PHOSPHATE 10 MG/ML IJ SOLN
INTRAMUSCULAR | Status: AC
  Filled 2018-06-05: qty 1

## 2018-06-05 MED ORDER — ONDANSETRON HCL 4 MG/2ML IJ SOLN
INTRAMUSCULAR | Status: DC
Start: 2018-06-05 — End: 2018-06-05
  Filled 2018-06-05: qty 2

## 2018-06-05 MED ORDER — CLONIDINE HCL 0.2 MG PO TABS
0.2000 mg | ORAL_TABLET | Freq: Once | ORAL | Status: AC
  Administered 2018-06-05: 0.2 mg via ORAL
  Filled 2018-06-05: qty 1

## 2018-06-05 MED ORDER — CELECOXIB 200 MG PO CAPS
200.0000 mg | ORAL_CAPSULE | ORAL | Status: AC
  Administered 2018-06-05: 200 mg via ORAL
  Filled 2018-06-05: qty 1

## 2018-06-05 MED ORDER — ONDANSETRON HCL 4 MG/2ML IJ SOLN
4.0000 mg | Freq: Once | INTRAMUSCULAR | Status: AC
  Administered 2018-06-05: 4 mg via INTRAVENOUS

## 2018-06-05 MED ORDER — LACTATED RINGERS IV SOLN
INTRAVENOUS | Status: DC
  Administered 2018-06-05: 09:00:00 via INTRAVENOUS

## 2018-06-05 MED ORDER — GABAPENTIN 300 MG PO CAPS
300.0000 mg | ORAL_CAPSULE | ORAL | Status: AC
  Administered 2018-06-05: 300 mg via ORAL
  Filled 2018-06-05: qty 1

## 2018-06-05 MED ORDER — IOPAMIDOL (ISOVUE-300) INJECTION 61%
INTRAVENOUS | Status: DC | PRN
  Administered 2018-06-05: 4 mL

## 2018-06-05 MED ORDER — ONDANSETRON HCL 4 MG/2ML IJ SOLN
INTRAMUSCULAR | Status: DC | PRN
  Administered 2018-06-05: 4 mg via INTRAVENOUS

## 2018-06-05 MED ORDER — SCOPOLAMINE 1 MG/3DAYS TD PT72
1.0000 | MEDICATED_PATCH | TRANSDERMAL | Status: DC
  Administered 2018-06-05: 1.5 mg via TRANSDERMAL
  Filled 2018-06-05: qty 1

## 2018-06-05 MED ORDER — HYDROCODONE-ACETAMINOPHEN 5-325 MG PO TABS: 1.0000 | ORAL_TABLET | Freq: Four times a day (QID) | ORAL | 0 refills | Status: AC | PRN

## 2018-06-05 MED ORDER — SUGAMMADEX SODIUM 200 MG/2ML IV SOLN
INTRAVENOUS | Status: DC | PRN
  Administered 2018-06-05: 200 mg via INTRAVENOUS

## 2018-06-05 MED ORDER — MEPERIDINE HCL 50 MG/ML IJ SOLN: 6.2500 mg | INTRAMUSCULAR | Status: DC | PRN

## 2018-06-05 MED ORDER — LACTATED RINGERS IR SOLN
Status: DC | PRN
  Administered 2018-06-05: 1000 mL

## 2018-06-05 MED ORDER — HYDROMORPHONE HCL 1 MG/ML IJ SOLN
INTRAMUSCULAR | Status: DC | PRN
  Administered 2018-06-05 (×2): 0.5 mg via INTRAVENOUS
  Administered 2018-06-05: 1 mg via INTRAVENOUS

## 2018-06-05 MED ORDER — PROPOFOL 10 MG/ML IV BOLUS
INTRAVENOUS | Status: DC | PRN
  Administered 2018-06-05: 200 mg via INTRAVENOUS

## 2018-06-05 MED ORDER — DEXAMETHASONE SODIUM PHOSPHATE 10 MG/ML IJ SOLN
INTRAMUSCULAR | Status: DC | PRN
  Administered 2018-06-05: 6 mg via INTRAVENOUS

## 2018-06-05 MED ORDER — ACETAMINOPHEN 500 MG PO TABS
1000.0000 mg | ORAL_TABLET | ORAL | Status: AC
  Administered 2018-06-05: 1000 mg via ORAL
  Filled 2018-06-05: qty 2

## 2018-06-05 MED ORDER — LIDOCAINE 2% (20 MG/ML) 5 ML SYRINGE
INTRAMUSCULAR | Status: DC | PRN
  Administered 2018-06-05: 80 mg via INTRAVENOUS

## 2018-06-05 MED ORDER — ONDANSETRON HCL 4 MG/2ML IJ SOLN
INTRAMUSCULAR | Status: AC
  Filled 2018-06-05: qty 2

## 2018-06-05 MED ORDER — KETOROLAC TROMETHAMINE 15 MG/ML IJ SOLN
INTRAMUSCULAR | Status: DC | PRN
  Administered 2018-06-05: 15 mg via INTRAVENOUS

## 2018-06-05 MED ORDER — ACETAMINOPHEN 10 MG/ML IV SOLN: 1000.0000 mg | Freq: Once | INTRAVENOUS | Status: DC | PRN

## 2018-06-05 MED ORDER — EPHEDRINE SULFATE 50 MG/ML IJ SOLN
INTRAMUSCULAR | Status: DC | PRN
  Administered 2018-06-05: 10 mg via INTRAVENOUS

## 2018-06-05 MED ORDER — HYDROCODONE-ACETAMINOPHEN 7.5-325 MG PO TABS: 1.0000 | ORAL_TABLET | Freq: Once | ORAL | Status: DC | PRN

## 2018-06-05 MED ORDER — PROPOFOL 10 MG/ML IV BOLUS
INTRAVENOUS | Status: AC
  Filled 2018-06-05: qty 20

## 2018-06-05 MED ORDER — PROMETHAZINE HCL 25 MG/ML IJ SOLN: 6.2500 mg | INTRAMUSCULAR | Status: DC | PRN

## 2018-06-05 MED FILL — HYDROCODON-APAP 5-325: 5-325 | 4 days supply | Qty: 15 | Fill #0

## 2018-06-05 SURGICAL SUPPLY — 30 items
ADH SKN CLS APL DERMABOND .7 (GAUZE/BANDAGES/DRESSINGS) ×1
APPLIER CLIP 5 13 M/L LIGAMAX5 (MISCELLANEOUS) ×3
APR CLP MED LRG 5 ANG JAW (MISCELLANEOUS) ×1
BAG SPEC RTRVL LRG 6X4 10 (ENDOMECHANICALS) ×1
CABLE HIGH FREQUENCY MONO STRZ (ELECTRODE) ×3 IMPLANT
CATH REDDICK CHOLANGI 4FR 50CM (CATHETERS) ×3 IMPLANT
CHLORAPREP W/TINT 26ML (MISCELLANEOUS) ×3 IMPLANT
CLIP APPLIE 5 13 M/L LIGAMAX5 (MISCELLANEOUS) ×1 IMPLANT
COVER MAYO STAND STRL (DRAPES) ×3 IMPLANT
DECANTER SPIKE VIAL GLASS SM (MISCELLANEOUS) ×3 IMPLANT
DERMABOND ADVANCED (GAUZE/BANDAGES/DRESSINGS) ×2
DERMABOND ADVANCED .7 DNX12 (GAUZE/BANDAGES/DRESSINGS) ×1 IMPLANT
DRAPE C-ARM 42X120 X-RAY (DRAPES) ×3 IMPLANT
ELECT REM PT RETURN 15FT ADLT (MISCELLANEOUS) ×3 IMPLANT
GLOVE BIO SURGEON STRL SZ7.5 (GLOVE) ×3 IMPLANT
GOWN STRL REUS W/TWL XL LVL3 (GOWN DISPOSABLE) ×9 IMPLANT
HEMOSTAT SURGICEL 4X8 (HEMOSTASIS) IMPLANT
IV CATH 14GX2 1/4 (CATHETERS) ×3 IMPLANT
KIT BASIN OR (CUSTOM PROCEDURE TRAY) ×3 IMPLANT
POUCH SPECIMEN RETRIEVAL 10MM (ENDOMECHANICALS) ×3 IMPLANT
SCISSORS LAP 5X35 DISP (ENDOMECHANICALS) ×3 IMPLANT
SET IRRIG TUBING LAPAROSCOPIC (IRRIGATION / IRRIGATOR) ×3 IMPLANT
SLEEVE XCEL OPT CAN 5 100 (ENDOMECHANICALS) ×6 IMPLANT
SUT MNCRL AB 4-0 PS2 18 (SUTURE) ×3 IMPLANT
TOWEL OR 17X26 10 PK STRL BLUE (TOWEL DISPOSABLE) ×3 IMPLANT
TOWEL OR NON WOVEN STRL DISP B (DISPOSABLE) ×3 IMPLANT
TRAY LAPAROSCOPIC (CUSTOM PROCEDURE TRAY) ×3 IMPLANT
TROCAR BLADELESS OPT 5 100 (ENDOMECHANICALS) ×3 IMPLANT
TROCAR XCEL BLUNT TIP 100MML (ENDOMECHANICALS) ×3 IMPLANT
TUBING INSUF HEATED (TUBING) ×3 IMPLANT

## 2018-06-05 NOTE — H&P (Signed)
Kristina Brown  Location: [facility] Patient #: [id number] DOB: 02/11/1996 Single / Language: [place] / Race: White Female   History of Present Illness The patient is a 22 year old female who presents with a complaint of gall bladder disease.  The PCP is Dr. Wyline Beady The patient was referred by Dr. Wyline Beady She is accompanied by her father.  She had her first child about 3 months ago. At the end of her pregnancy she developed abdominal pain. She had an ultrasound which showed an inflamed gallbladder. She's had at least 2 attacks since her pregnancy. The most recent ultrasound on 04/29/2018 shows gall stones. She is not running any fever. She has not been jaundiced. She has no family history of gallbladder disease. She's had no prior abdominal surgery. She has no stomach, liver, or colon disease. She had her child by vaginal delivery.  I discussed with the patient the indications and risks of gall bladder surgery. The primary risks of gall bladder surgery include, but are not limited to, bleeding, infection, common bile duct injury, and open surgery. There is also the risk that the patient may have continued symptoms after surgery. We discussed the typical post-operative recovery course. I tried to answer the patient's questions. I gave the patient literature about gall bladder surgery.  The patient wants surgery next week and I'm unavailable. Dr. Carolynne Edouard, who is LDOW next week, his stream care the patient.  Past Medical History: 1. Recent pregnancy  Social History: Unmarried. Has one child: Kristina Brown, 37 months old  She lives with her parents. 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   Past Surgical History  Oral Surgery   Diagnostic Studies History  Colonoscopy  never Mammogram  never Pap Smear  never  Allergies  Latex  Allergies Reconciled   Medication History  Sprintec 28 (0.25-35MG -MCG Tablet, Oral)  Active. Medications Reconciled  Social History  Alcohol use  Occasional alcohol use. Caffeine use  Carbonated beverages, Coffee, Tea. No drug use  Tobacco use  Never smoker.  Family History  Breast Cancer  Father. Diabetes Mellitus  Father. Hypertension  Father. Thyroid problems  Sister.  Pregnancy / Birth History  Age at menarche  12 years. Contraceptive History  Oral contraceptives. Gravida  1 Length (months) of breastfeeding  3-6 Maternal age  12-25 Para  1 Regular periods   Other Problems  Cholelithiasis  Myocardial infarction     Review of Systems General Not Present- Appetite Loss, Chills, Fatigue, Fever, Night Sweats, Weight Gain and Weight Loss. Skin Not Present- Change in Wart/Mole, Dryness, Hives, Jaundice, New Lesions, Non-Healing Wounds, Rash and Ulcer. HEENT Not Present- Earache, Hearing Loss, Hoarseness, Nose Bleed, Oral Ulcers, Ringing in the Ears, Seasonal Allergies, Sinus Pain, Sore Throat, Visual Disturbances, Wears glasses/contact lenses and Yellow Eyes. Respiratory Not Present- Bloody sputum, Chronic Cough, Difficulty Breathing, Snoring and Wheezing. Breast Not Present- Breast Mass, Breast Pain, Nipple Discharge and Skin Changes. Cardiovascular Not Present- Chest Pain, Difficulty Breathing Lying Down, Leg Cramps, Palpitations, Rapid Heart Rate, Shortness of Breath and Swelling of Extremities. Gastrointestinal Not Present- Abdominal Pain, Bloating, Bloody Stool, Change in Bowel Habits, Chronic diarrhea, Constipation, Difficulty Swallowing, Excessive gas, Gets full quickly at meals, Hemorrhoids, Indigestion, Nausea, Rectal Pain and Vomiting. Female Genitourinary Not Present- Frequency, Nocturia, Painful Urination, Pelvic Pain and Urgency. Musculoskeletal Not Present- Back Pain, Joint Pain, Joint Stiffness, Muscle Pain, Muscle Weakness and Swelling of Extremities. Neurological Not Present- Decreased Memory, Fainting, Headaches, Numbness,  Seizures, Tingling, Tremor,  Trouble walking and Weakness. Psychiatric Not Present- Anxiety, Bipolar, Change in Sleep Pattern, Depression, Fearful and Frequent crying. Endocrine Not Present- Cold Intolerance, Excessive Hunger, Hair Changes, Heat Intolerance, Hot flashes and New Diabetes. Hematology Not Present- Blood Thinners, Easy Bruising, Excessive bleeding, Gland problems, HIV and Persistent Infections.  Vitals  Weight: 169.13 lb Height: 65in Body Surface Area: 1.84 m Body Mass Index: 28.14 kg/m  Temp.: 99.7F(Oral)  Pulse: 127 (Regular)  BP: 122/80 (Sitting, Left Arm, Standard)       Physical Exam  The physical exam findings are as follows: Note:General: WN young WF who is alert and generally healthy appearing. HEENT: Normal. Pupils equal.  Neck: Supple. No mass. No thyroid mass. Lymph Nodes: No supraclavicular or cervical nodes.  Lungs: Clear to auscultation and symmetric breath sounds. Heart: RRR. No murmur or rub.  Abdomen: Soft. No mass. No tenderness. No hernia. Normal bowel sounds. No abdominal scars. She says that she is a little sore in her epigastrium. Rectal: Not done.  Extremities: Good strength and ROM in upper and lower extremities.  Neurologic: Grossly intact to motor and sensory function. Psychiatric: Has normal mood and affect. Behavior is normal.    Assessment & Plan  CALCULUS OF GALLBLADDER WITH CHOLECYSTITIS WITHOUT BILIARY OBSTRUCTION, UNSPECIFIED CHOLECYSTITIS ACUITY (K80.10) Impression: Plan:  1) Lap chole next week. Dr. Carolynne Edouardoth to assume care. He was in the office and talked to the patient.

## 2018-06-05 NOTE — Anesthesia Postprocedure Evaluation (Signed)
Anesthesia Post Note  Patient: Kristina Brown  Procedure(s) Performed: LAPAROSCOPIC CHOLECYSTECTOMY WITH INTRAOPERATIVE CHOLANGIOGRAM ERAS PATHWAY (N/A )     Patient location during evaluation: PACU Anesthesia Type: General Level of consciousness: awake and alert Pain management: pain level controlled Vital Signs Assessment: post-procedure vital signs reviewed and stable Respiratory status: spontaneous breathing, nonlabored ventilation, respiratory function stable and patient connected to nasal cannula oxygen Cardiovascular status: blood pressure returned to baseline and stable Postop Assessment: no apparent nausea or vomiting Anesthetic complications: no    Last Vitals:  Vitals:   06/05/18 1235 06/05/18 1245  BP: (!) 117/47 (!) 109/51  Pulse: 98 90  Resp: 18 18  Temp: 36.4 C   SpO2: 100% 100%    Last Pain:  Vitals:   06/05/18 1235  TempSrc:   PainSc: 10-Worst pain ever                 Trevor IhaStephen A Houser

## 2018-06-05 NOTE — Anesthesia Procedure Notes (Signed)
Procedure Name: Intubation Date/Time: 06/05/2018 11:16 AM Performed by: West Pugh, CRNA Pre-anesthesia Checklist: Patient identified, Emergency Drugs available, Suction available, Patient being monitored and Timeout performed Patient Re-evaluated:Patient Re-evaluated prior to induction Oxygen Delivery Method: Circle system utilized Preoxygenation: Pre-oxygenation with 100% oxygen Induction Type: IV induction Ventilation: Mask ventilation without difficulty Laryngoscope Size: Mac and 3 Grade View: Grade I Tube type: Oral Tube size: 7.0 mm Number of attempts: 1 Airway Equipment and Method: Stylet Placement Confirmation: ETT inserted through vocal cords under direct vision,  positive ETCO2,  CO2 detector and breath sounds checked- equal and bilateral Secured at: 22 cm Tube secured with: Tape Dental Injury: Teeth and Oropharynx as per pre-operative assessment

## 2018-06-05 NOTE — Discharge Instructions (Signed)
Laparoscopic Cholecystectomy, Care After This sheet gives you information about how to care for yourself after your procedure. Your doctor may also give you more specific instructions. If you have problems or questions, contact your doctor. Follow these instructions at home: Care for cuts from surgery (incisions)   Follow instructions from your doctor about how to take care of your cuts from surgery. Make sure you: ? Wash your hands with soap and water before you change your bandage (dressing). If you cannot use soap and water, use hand sanitizer. ? Change your bandage as told by your doctor. ? Leave stitches (sutures), skin glue, or skin tape (adhesive) strips in place. They may need to stay in place for 2 weeks or longer. If tape strips get loose and curl up, you may trim the loose edges. Do not remove tape strips completely unless your doctor says it is okay.  Do not take baths, swim, or use a hot tub until your doctor says it is okay. Ask your doctor if you can take showers. You may only be allowed to take sponge baths for bathing.  Check your surgical cut area every day for signs of infection. Check for: ? More redness, swelling, or pain. ? More fluid or blood. ? Warmth. ? Pus or a bad smell. Activity  Do not drive or use heavy machinery while taking prescription pain medicine.  Do not lift anything that is heavier than 10 lb (4.5 kg) until your doctor says it is okay.  Do not play contact sports until your doctor says it is okay.  Do not drive for 24 hours if you were given a medicine to help you relax (sedative).  Rest as needed. Do not return to work or school until your doctor says it is okay. General instructions  Take over-the-counter and prescription medicines only as told by your doctor.  To prevent or treat constipation while you are taking prescription pain medicine, your doctor may recommend that you: ? Drink enough fluid to keep your pee (urine) clear or pale  yellow. ? Take over-the-counter or prescription medicines. ? Eat foods that are high in fiber, such as fresh fruits and vegetables, whole grains, and beans. ? Limit foods that are high in fat and processed sugars, such as fried and sweet foods. Contact a doctor if:  You develop a rash.  You have more redness, swelling, or pain around your surgical cuts.  You have more fluid or blood coming from your surgical cuts.  Your surgical cuts feel warm to the touch.  You have pus or a bad smell coming from your surgical cuts.  You have a fever.  One or more of your surgical cuts breaks open. Get help right away if:  You have trouble breathing.  You have chest pain.  You have pain that is getting worse in your shoulders.  You faint or feel dizzy when you stand.  You have very bad pain in your belly (abdomen).  You are sick to your stomach (nauseous) for more than one day.  You have throwing up (vomiting) that lasts for more than one day.  You have leg pain. This information is not intended to replace advice given to you by your health care provider. Make sure you discuss any questions you have with your health care provider. Document Released: 08/29/2008 Document Revised: 06/10/2016 Document Reviewed: 05/08/2016 Elsevier Interactive Patient Education  2018 [employer]  General Anesthesia, Adult, Care After These instructions provide you with information about caring for  yourself after your procedure. Your health care provider may also give you more specific instructions. Your treatment has been planned according to current medical practices, but problems sometimes occur. Call your health care provider if you have any problems or questions after your procedure. What can I expect after the procedure? After the procedure, it is common to have:  Vomiting.  A sore throat.  Mental slowness.  It is common to feel:  Nauseous.  Cold or shivery.  Sleepy.  Tired.  Sore or  achy, even in parts of your body where you did not have surgery.  Follow these instructions at home: For at least 24 hours after the procedure:  Do not: ? Participate in activities where you could fall or become injured. ? Drive. ? Use heavy machinery. ? Drink alcohol. ? Take sleeping pills or medicines that cause drowsiness. ? Make important decisions or sign legal documents. ? Take care of children on your own.  Rest. Eating and drinking  If you vomit, drink water, juice, or soup when you can drink without vomiting.  Drink enough fluid to keep your urine clear or pale yellow.  Make sure you have little or no nausea before eating solid foods.  Follow the diet recommended by your health care provider. General instructions  Have a responsible adult stay with you until you are awake and alert.  Return to your normal activities as told by your health care provider. Ask your health care provider what activities are safe for you.  Take over-the-counter and prescription medicines only as told by your health care provider.  If you smoke, do not smoke without supervision.  Keep all follow-up visits as told by your health care provider. This is important. Contact a health care provider if:  You continue to have nausea or vomiting at home, and medicines are not helpful.  You cannot drink fluids or start eating again.  You cannot urinate after 8-12 hours.  You develop a skin rash.  You have fever.  You have increasing redness at the site of your procedure. Get help right away if:  You have difficulty breathing.  You have chest pain.  You have unexpected bleeding.  You feel that you are having a life-threatening or urgent problem. This information is not intended to replace advice given to you by your health care provider. Make sure you discuss any questions you have with your health care provider. Document Released: 02/26/2001 Document Revised: 04/24/2016 Document  Reviewed: 11/04/2015 Elsevier Interactive Patient Education  [employer].

## 2018-06-05 NOTE — Interval H&P Note (Signed)
History and Physical Interval Note:  06/05/2018 9:14 AM  Kristina Brown  has presented today for surgery, with the diagnosis of gallstones  The various methods of treatment have been discussed with the patient and family. After consideration of risks, benefits and other options for treatment, the patient has consented to  Procedure(s): LAPAROSCOPIC CHOLECYSTECTOMY WITH INTRAOPERATIVE CHOLANGIOGRAM ERAS PATHWAY (N/A) as a surgical intervention .  The patient's history has been reviewed, patient examined, no change in status, stable for surgery.  I have reviewed the patient's chart and labs.  Questions were answered to the patient's satisfaction.     TOTH III,Rogue Rafalski S

## 2018-06-05 NOTE — Op Note (Signed)
06/05/2018  12:16 PM  PATIENT:  Kristina Brown  22 y.o. female  PRE-OPERATIVE DIAGNOSIS:  gallstones  POST-OPERATIVE DIAGNOSIS:  gallstones  PROCEDURE:  Procedure(s): LAPAROSCOPIC CHOLECYSTECTOMY WITH INTRAOPERATIVE CHOLANGIOGRAM ERAS PATHWAY (N/A)  SURGEON:  Surgeon(s) and Role:    * Griselda Mineroth, Paul III, MD - Primary  PHYSICIAN ASSISTANT:   ASSISTANTS: Zola ButtonWill Jennings, PA   ANESTHESIA:   local and general  EBL:  minimal   BLOOD ADMINISTERED:none  DRAINS: none   LOCAL MEDICATIONS USED:  MARCAINE     SPECIMEN:  Source of Specimen:  gallbladder  DISPOSITION OF SPECIMEN:  PATHOLOGY  COUNTS:  YES  TOURNIQUET:  * No tourniquets in log *  DICTATION: .Dragon Dictation   Procedure: After informed consent was obtained the patient was brought to the operating room and placed in the supine position on the operating room table. After adequate induction of general anesthesia the patient's abdomen was prepped with ChloraPrep allowed to dry and draped in usual sterile manner. An appropriate timeout was performed. The area below the umbilicus was infiltrated with quarter percent  Marcaine. A small incision was made with a 15 blade knife. The incision was carried down through the subcutaneous tissue bluntly with a hemostat and [employer] retractors. The linea alba was identified. The linea alba was incised with a 15 blade knife and each side was grasped with Coker clamps. The preperitoneal space was then probed with a hemostat until the peritoneum was opened and access was gained to the abdominal cavity. A 0 Vicryl pursestring stitch was placed in the fascia surrounding the opening. A Hassan cannula was then placed through the opening and anchored in place with the previously placed Vicryl purse string stitch. The abdomen was insufflated with carbon dioxide without difficulty. A laparoscope was inserted [facility] in the right upper quadrant was inspected. Next the epigastric region  was infiltrated with % Marcaine. A small incision was made with a 15 blade knife. A 5 mm port was placed bluntly through this incision into the abdominal cavity under direct vision. Next 2 sites were chosen laterally on the right side of the abdomen for placement of 5 mm ports. Each of these areas was infiltrated with quarter percent Marcaine. Small stab incisions were made with a 15 blade knife. 5 mm ports were then placed bluntly through these incisions into the abdominal cavity under direct vision without difficulty. A blunt grasper was placed through the lateralmost 5 mm port and used to grasp the dome of the gallbladder and elevated anteriorly and superiorly. Another blunt grasper was placed through the other 5 mm port and used to retract the body and neck of the gallbladder. A dissector was placed through the epigastric port and using the electrocautery the peritoneal reflection at the gallbladder neck was opened. Blunt dissection was then carried out in this area until the gallbladder neck-cystic duct junction was readily identified and a good window was created. A single clip was placed on the gallbladder neck. A small  ductotomy was made just below the clip with laparoscopic scissors. A 14-gauge Angiocath was then placed through the anterior abdominal wall under direct vision. A Reddick cholangiogram catheter was then placed through the Angiocath and flushed. The catheter was then placed in the cystic duct and anchored in place with a clip. A cholangiogram was obtained that showed no filling defects good emptying into the duodenum an adequate length on the cystic duct. The anchoring clip and catheters were then removed from the patient. 3  clips were placed proximally on the cystic duct and the duct was divided between the 2 sets of clips. Posterior to this the cystic artery was identified and again dissected bluntly in a circumferential manner until a good window  was created. 2 clips were placed  proximally and one distally on the artery and the artery was divided between the 2 sets of clips. Next a laparoscopic hook cautery device was used to separate the gallbladder from the liver bed. Prior to completely detaching the gallbladder from the liver bed the liver bed was inspected and several small bleeding points were coagulated with the electrocautery until the area was completely hemostatic. The gallbladder was then detached the rest of it from the liver bed without difficulty. A laparoscopic bag was inserted through the hassan port. The laparoscope was moved to the epigastric port. The gallbladder was placed within the bag and the bag was sealed.  The bag with the gallbladder was then removed with the [facility] cannula through the infraumbilical port without difficulty. The fascial defect was then closed with the previously placed Vicryl pursestring stitch as well as with another figure-of-eight 0 Vicryl stitch. The liver bed was inspected again and found to be hemostatic. The abdomen was irrigated with copious amounts of saline until the effluent was clear. The ports were then removed under direct vision without difficulty and were found to be hemostatic. The gas was allowed to escape. The skin incisions were all closed with interrupted 4-0 Monocryl subcuticular stitches. Dermabond dressings were applied. The patient tolerated the procedure well. At the end of the case all needle sponge and instrument counts were correct. The patient was then awakened and taken to recovery in stable condition  PLAN OF CARE: Discharge to home after PACU  PATIENT DISPOSITION:  PACU - hemodynamically stable.   Delay start of Pharmacological VTE agent (>24hrs) due to surgical blood loss or risk of bleeding: not applicable

## 2018-06-05 NOTE — Transfer of Care (Signed)
Immediate Anesthesia Transfer of Care Note  Patient: Kristina Brown  Procedure(s) Performed: LAPAROSCOPIC CHOLECYSTECTOMY WITH INTRAOPERATIVE CHOLANGIOGRAM ERAS PATHWAY (N/A )  Patient Location: PACU  Anesthesia Type:General  Level of Consciousness: awake and patient cooperative  Airway & Oxygen Therapy: Patient Spontanous Breathing and Patient connected to face mask oxygen  Post-op Assessment: Report given to RN and Post -op Vital signs reviewed and stable  Post vital signs: Reviewed and stable  Last Vitals:  Vitals Value Taken Time  BP 117/47 06/05/2018 12:35 PM  Temp    Pulse 85 06/05/2018 12:39 PM  Resp 15 06/05/2018 12:39 PM  SpO2 100 % 06/05/2018 12:39 PM  Vitals shown include unvalidated device data.  Last Pain:  Vitals:   06/05/18 0902  TempSrc:   PainSc: 0-No pain         Complications: No apparent anesthesia complications

## 2018-06-07 ENCOUNTER — Encounter (HOSPITAL_COMMUNITY): Payer: Self-pay | Admitting: General Surgery

## 2018-06-20 ENCOUNTER — Ambulatory Visit: Payer: PRIVATE HEALTH INSURANCE | Admitting: Family Medicine

## 2018-09-01 IMAGING — RF DG CHOLANGIOGRAM OPERATIVE
1 series · 4 of 4 positions shown · non-contrast
Comparison: Ultrasound 04/29/2018

CLINICAL DATA: Cholelithiasis

EXAM:
INTRAOPERATIVE CHOLANGIOGRAM
TECHNIQUE: Cholangiographic images from the C-arm fluoroscopic device were
submitted for interpretation post-operatively. Please see the
procedural report for the amount of contrast and the fluoroscopy
time utilized.

[Series 1: run · 4 of 74 frames shown]
[frame 12/74]
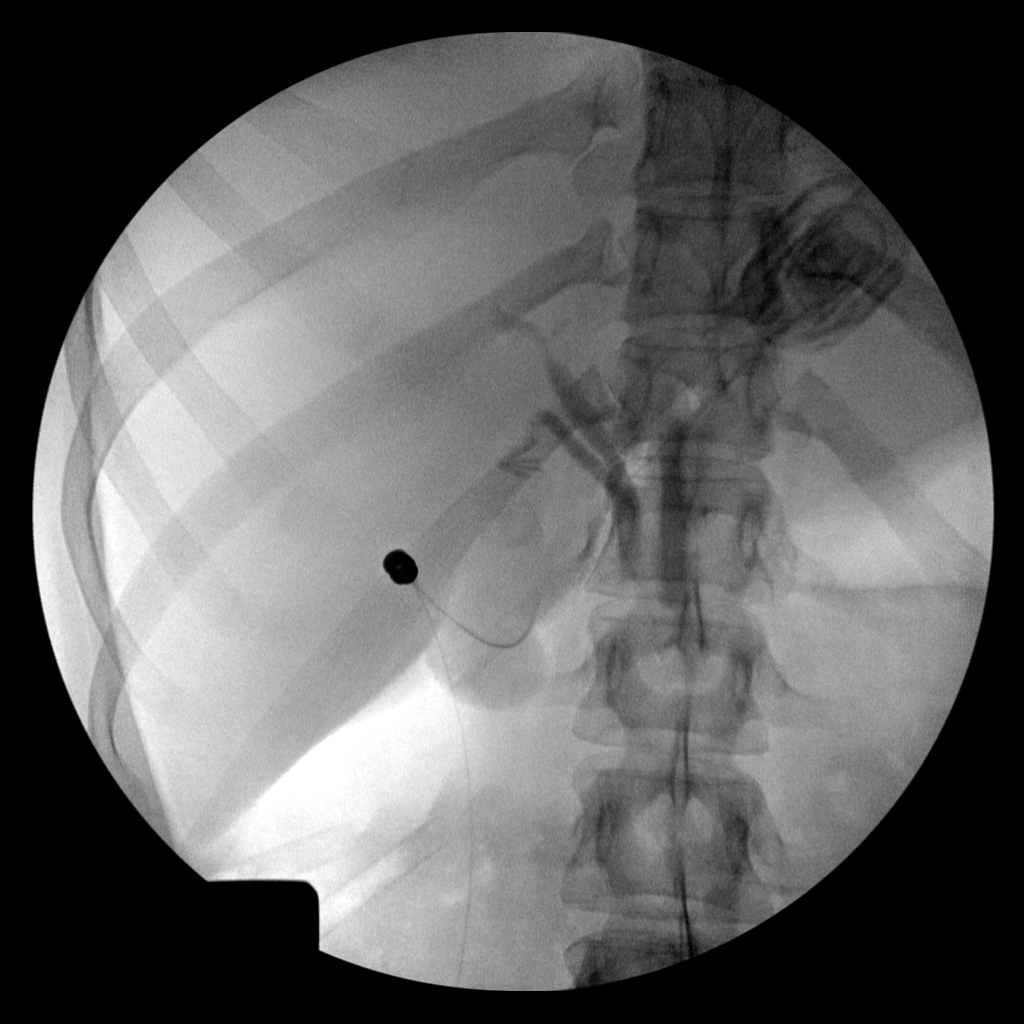
[frame 26/74]
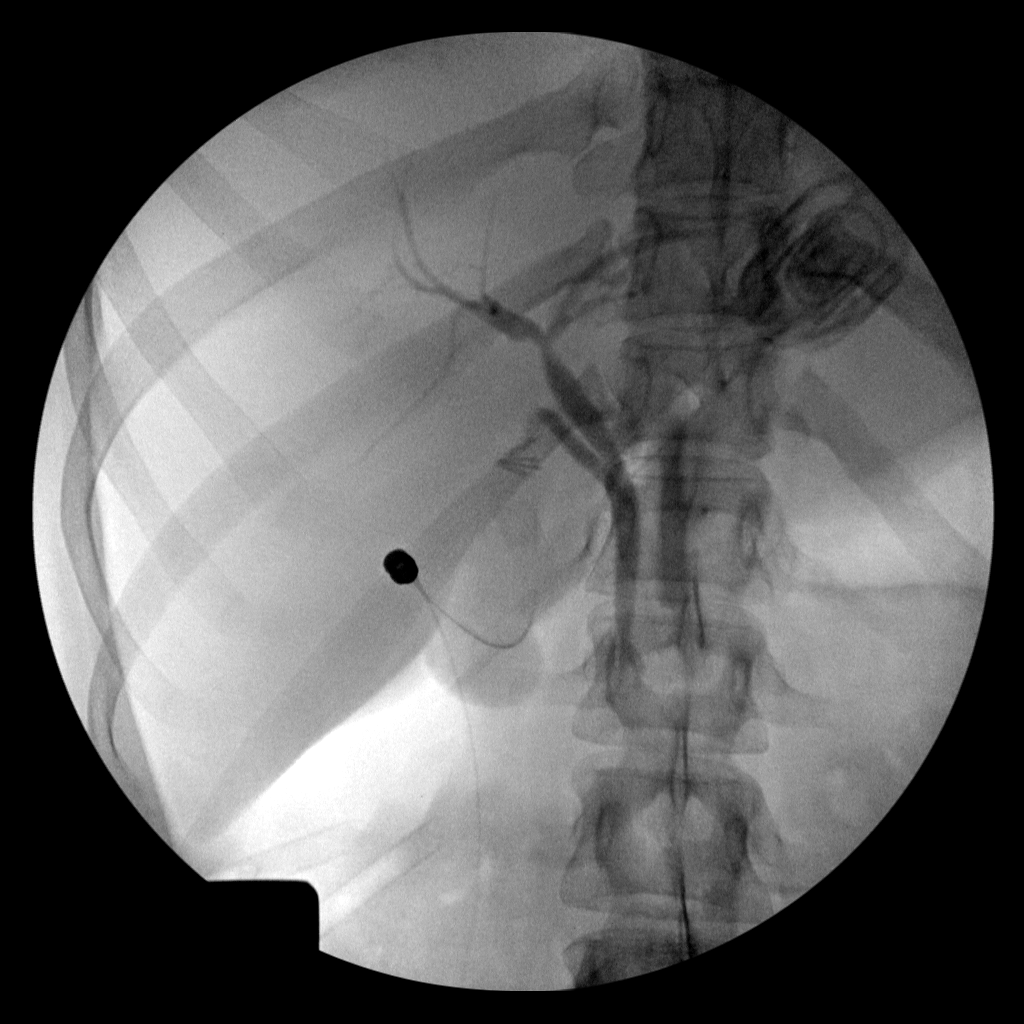
[frame 38/74]
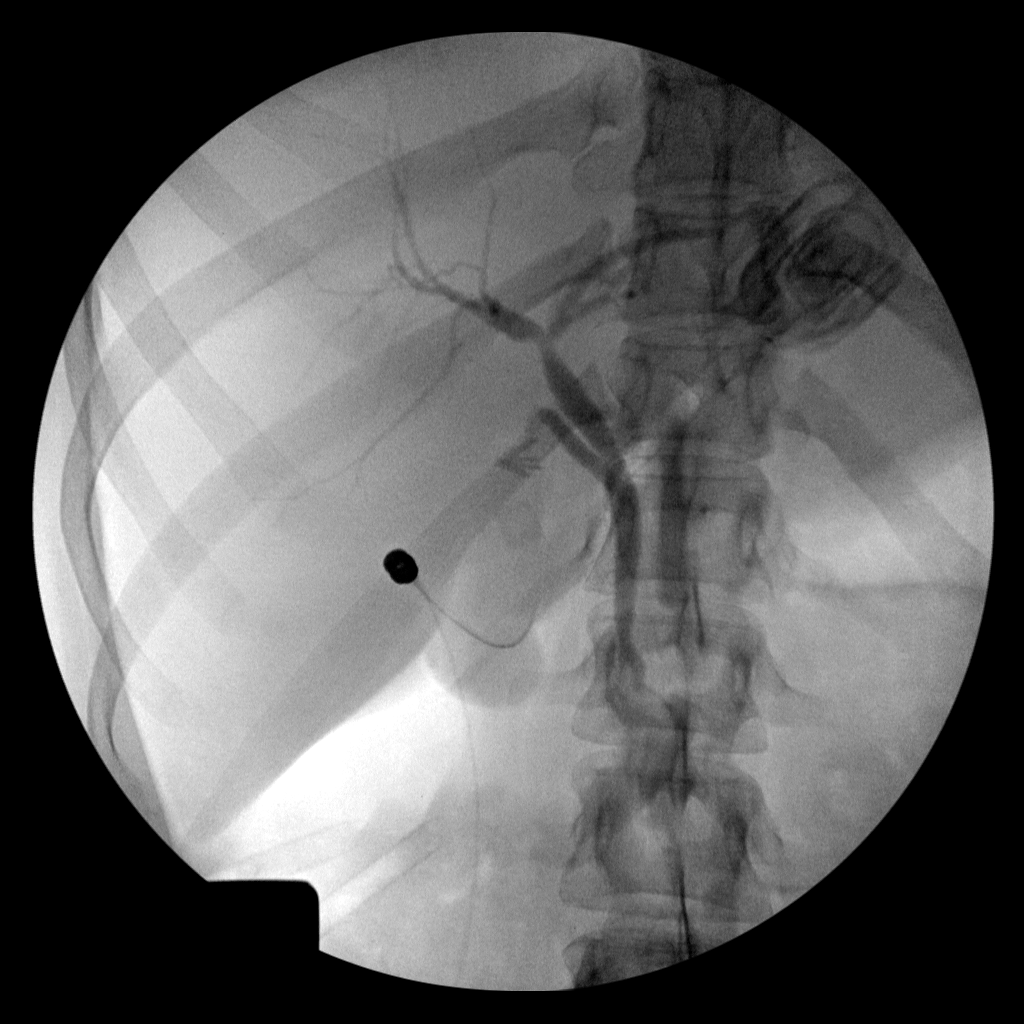
[frame 63/74]
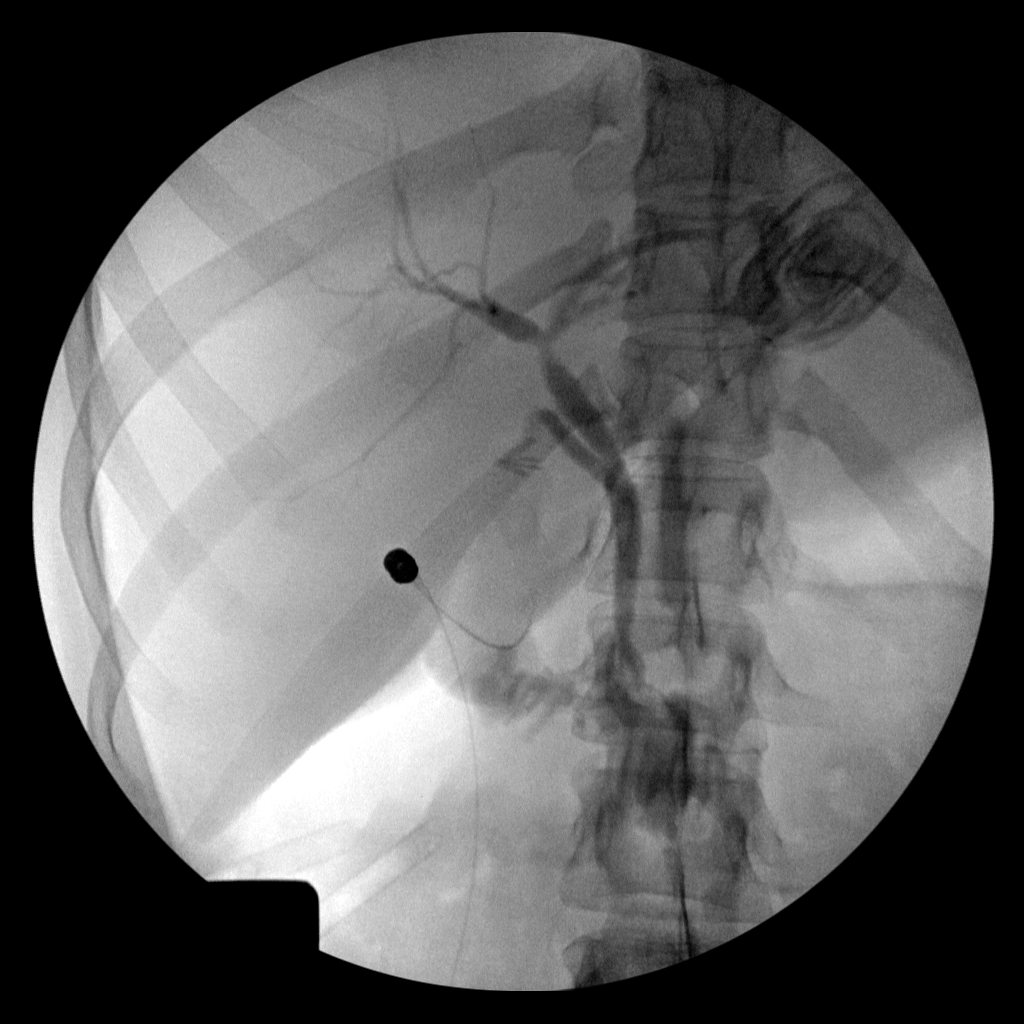

[4 of 4 positions shown; findings below may reference images not displayed]

FINDINGS: No persistent filling defects in the common duct. Intrahepatic ducts
are incompletely visualized, appearing decompressed centrally.
Contrast passes into the duodenum.

:
Negative for retained common duct stone.

## 2018-09-12 DIAGNOSIS — Z23 Encounter for immunization: Secondary | ICD-10-CM | POA: Diagnosis not present

## 2020-04-19 IMAGING — US US ABDOMEN LIMITED
1 series · 14 of 25 positions shown · non-contrast
Comparison: 02/21/2018

CLINICAL DATA: Right upper quadrant pain for 2 weeks.  Gallstones.

EXAM:
ULTRASOUND ABDOMEN LIMITED RIGHT UPPER QUADRANT

[Series 1: us abdomen limited · 0.22mm/px · 14 of 37 slices shown]
[im 1/37]
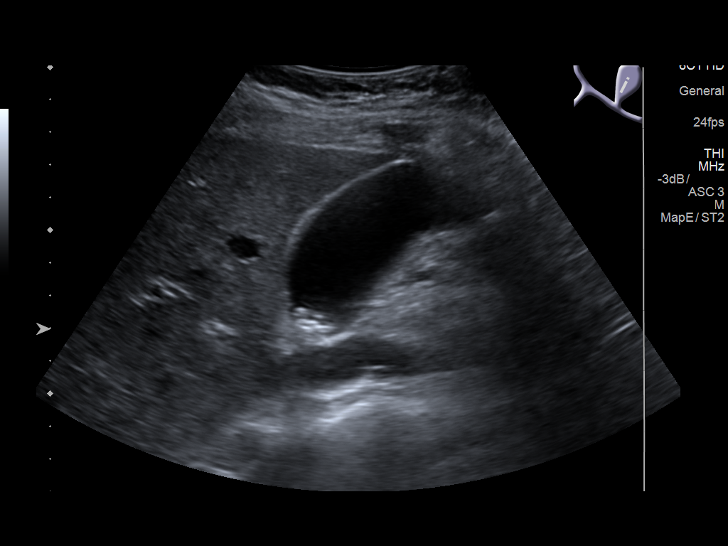
[im 4/37]
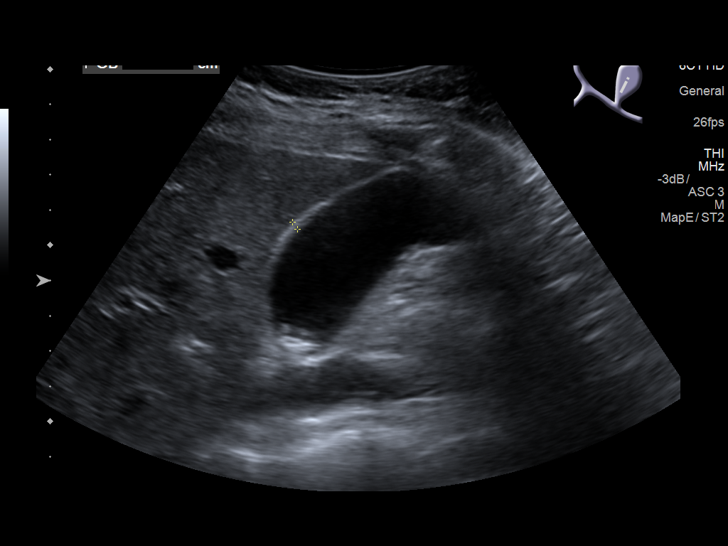
[im 7/37]
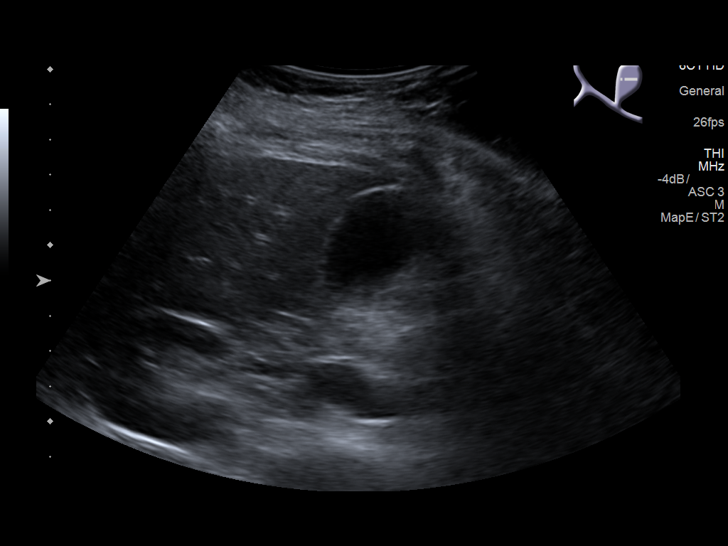
[im 10/37]
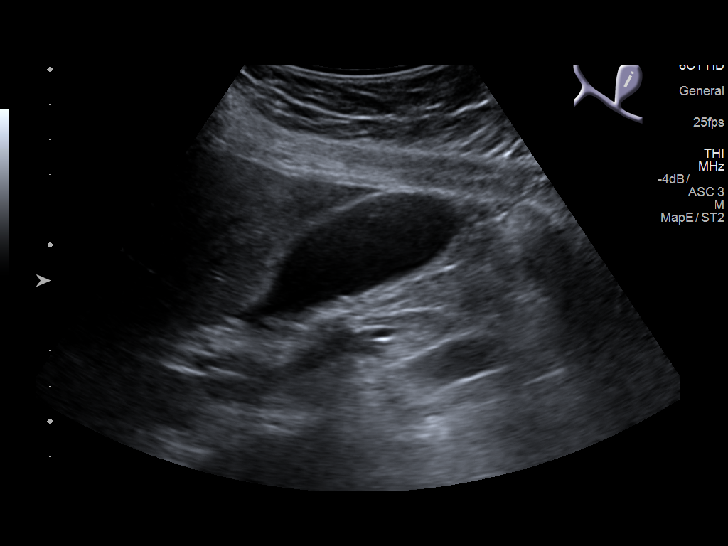
[im 13/37]
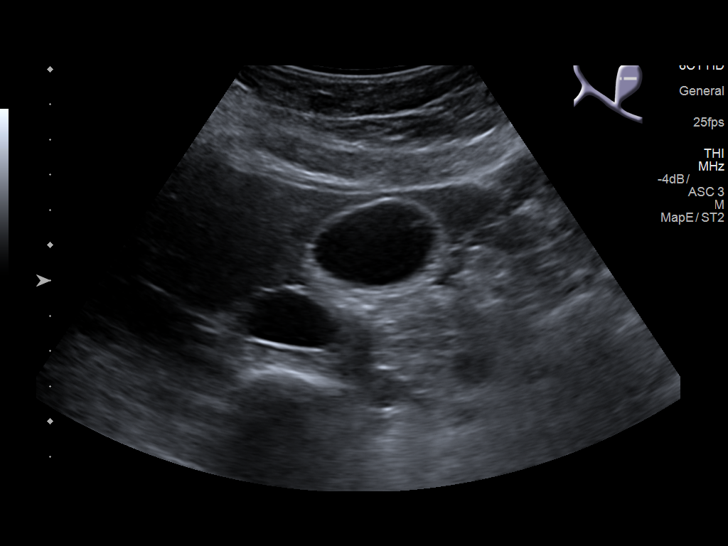
[im 14/37]
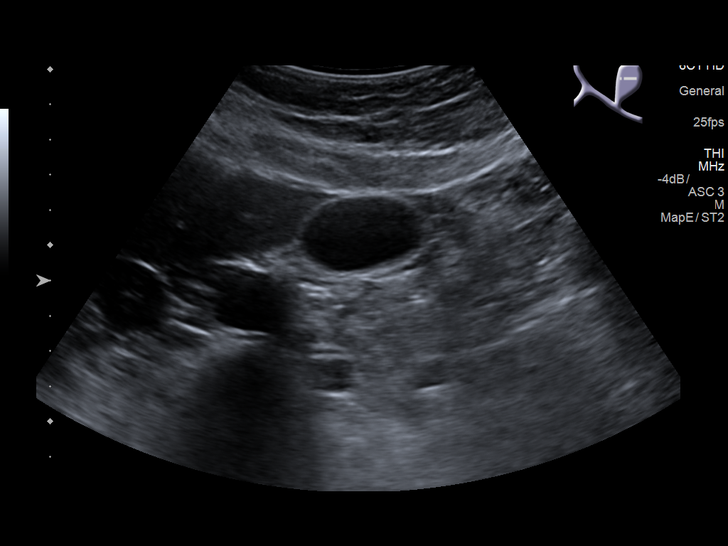
[im 17/37]
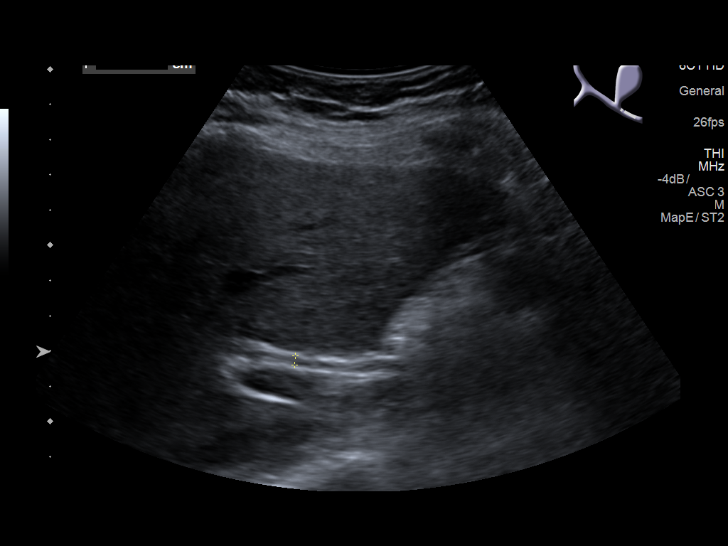
[im 20/37]
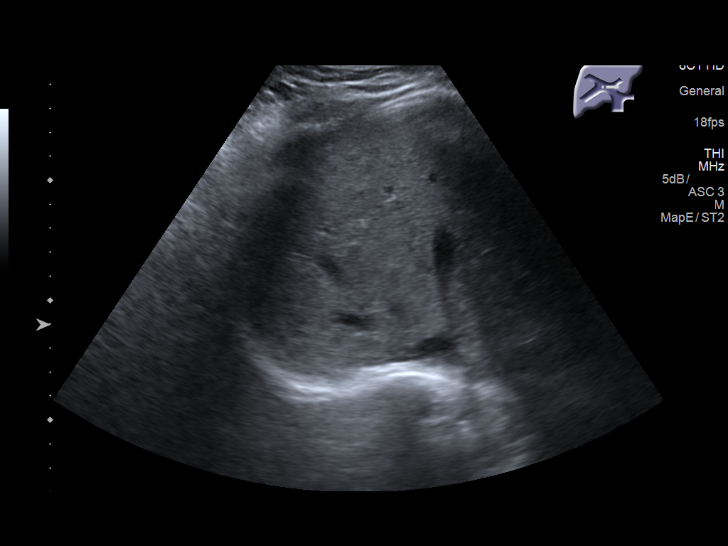
[im 23/37]
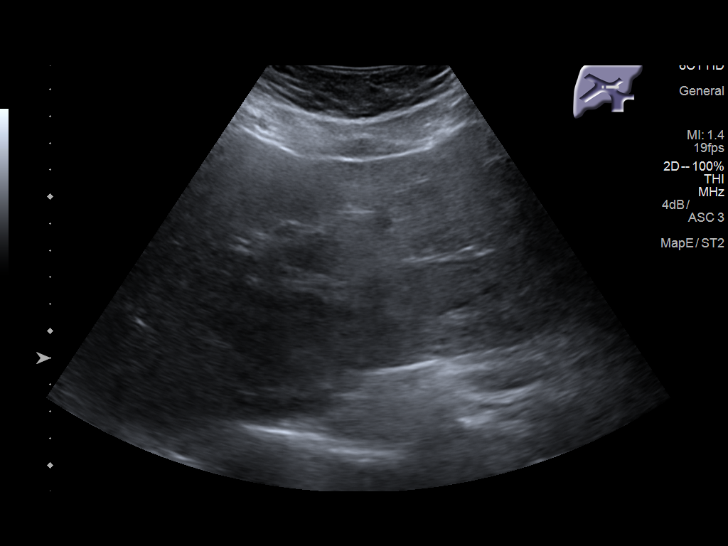
[im 25/37]
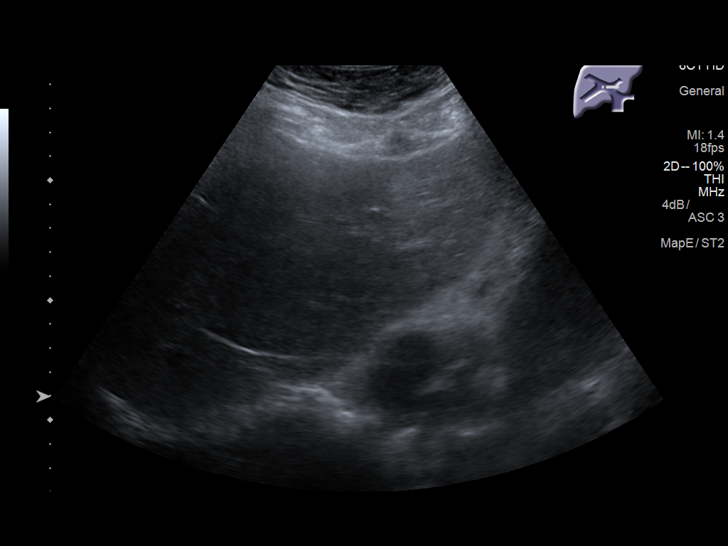
[im 28/37]
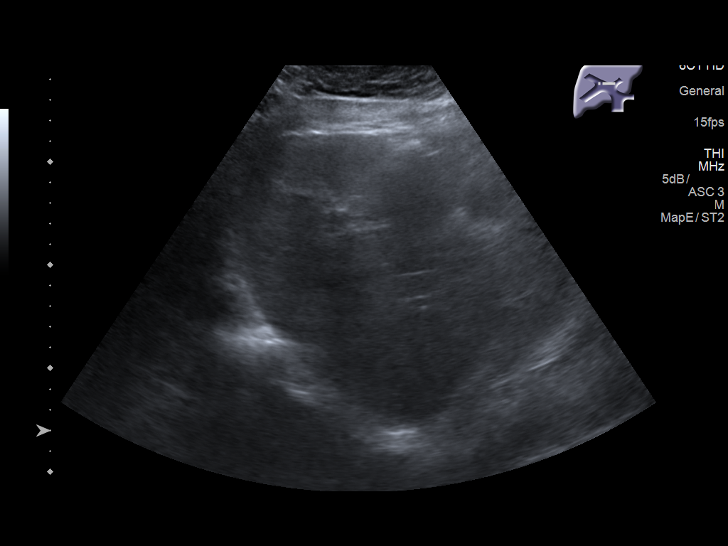
[im 31/37]
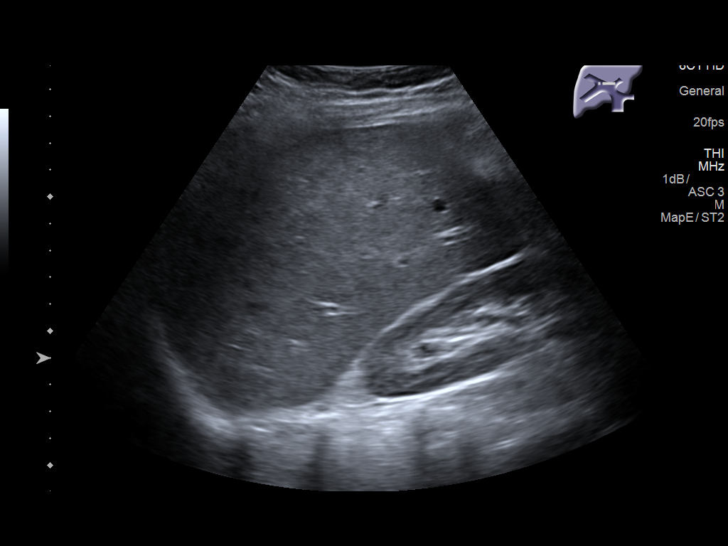
[im 34/37]
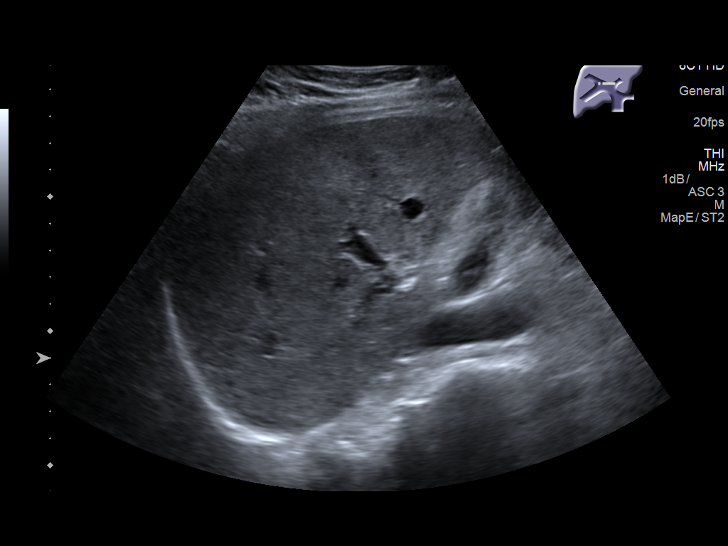
[im 37/37]
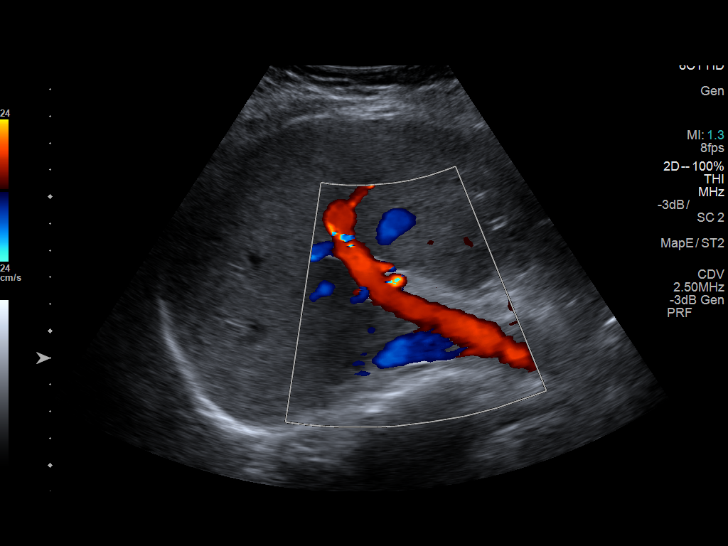

[14 of 25 positions shown; findings below may reference images not displayed]

FINDINGS: Gallbladder:

Cholelithiasis with small stones in the dependent gallbladder. No
gallbladder wall thickening or edema. Murphy's sign is negative.

Common bile duct:

Diameter: 2.6 mm, normal

Liver:

No focal lesion identified. Within normal limits in parenchymal
echogenicity. Portal vein is patent on color Doppler imaging with
normal direction of blood flow towards the liver.
IMPRESSION: Cholelithiasis. No additional changes to suggest acute
cholecystitis.

## 2021-07-06 DIAGNOSIS — R21 Rash and other nonspecific skin eruption: Secondary | ICD-10-CM | POA: Diagnosis not present

## 2021-07-06 DIAGNOSIS — L0100 Impetigo, unspecified: Secondary | ICD-10-CM | POA: Diagnosis not present

## 2022-01-19 DIAGNOSIS — H5213 Myopia, bilateral: Secondary | ICD-10-CM | POA: Diagnosis not present

## 2022-01-19 DIAGNOSIS — H52222 Regular astigmatism, left eye: Secondary | ICD-10-CM | POA: Diagnosis not present
